# Patient Record
Sex: Female | Born: 1985 | Race: White | Hispanic: No | Marital: Married | Smoking: Never smoker
Health system: Southern US, Community
[De-identification: ages and names within clinical notes are randomized; demographics above are authoritative.]

## PROBLEM LIST (undated history)

## (undated) DIAGNOSIS — D649 Anemia, unspecified: Secondary | ICD-10-CM

## (undated) HISTORY — PX: LAMINECTOMY: SHX219

---

## 2010-10-28 ENCOUNTER — Encounter: Payer: Self-pay | Admitting: Gastroenterology

## 2013-06-05 LAB — HM PAP SMEAR

## 2014-08-21 ENCOUNTER — Ambulatory Visit: Payer: Self-pay | Admitting: Optometry

## 2014-08-21 ENCOUNTER — Encounter: Payer: Self-pay | Admitting: Optometry

## 2014-08-21 DIAGNOSIS — H5213 Myopia, bilateral: Secondary | ICD-10-CM

## 2014-08-23 DIAGNOSIS — H5213 Myopia, bilateral: Secondary | ICD-10-CM | POA: Insufficient documentation

## 2014-08-23 NOTE — Progress Notes (Signed)
Outpatient Visit      Patient name: Peggy White  DOB: May 02, 1986       Age: 30 y.o.  MR#: [id number]    Encounter Date: 08/21/2014    Subjective:      Chief Complaint   Patient presents with    Blurred Vision     DV NV      HPI     Blurred Vision    Additional comments: DV NV            Comments   Peggy White is a 29 y.o. female present today for new PT annual exam  PT is a contact wearer. PT wears Air Optix -3.00 OU. Pt is comfortable   with Current lenses  Pt would like a contact lens rx only. PT considering Dailies   -dr Carroll Sage, -redness, -itching, -burning, -tearing, -photophobia,   -diplopia, -headaches, -pain, -flashes, -floaters     Pt has no history of eye trauma or surgery.   Last exam over 1 yrs ago             currently has no medications in their medication list.     has No Known Allergies (drug, envir, food or latex).      No past medical history on file.   No past surgical history on file.     Specialty Problems        Ophthalmology Problems    Myopia, bilateral               ROS     Positive for: Eyes         Objective:     Base Eye Exam     Visual Acuity (Snellen - Linear)      Right Left   Dist cc 20/20 20/25   Near cc J1+ OU          Tonometry (Tonopen, 3:12 PM)      Right Left   Pressure 14 14         Pupils      Pupils Dark React APD   Right PERRLA 6 Brisk None   Left PERRLA 6 Brisk None         Visual Fields (Counting fingers)      Left Right   Result Full Full         Extraocular Movement      Right Left   Result Full Full         Neuro/Psych     Oriented x3:  Yes    Mood/Affect:  Normal      Dilation     Both eyes:  2.5% Phenylephrine, 1.0% Tropicamide @ 3:13 PM            Additional Tests     Amsler      Right Left   Amsler Normal Normal               Slit Lamp and Fundus Exam     External Exam      Right Left    External Normal ocular adnexae, lacrimal gland & drainage, orbits Normal ocular adnexae, lacrimal gland & drainage, orbits      Slit Lamp Exam      Right Left    Lids/Lashes  Normal structure & position Normal structure & position    Conjunctiva/Sclera Normal bulbar/palpebral, conjunctiva, sclera Normal bulbar/palpebral, conjunctiva, sclera    Cornea Normal epithelium, stroma, endothelium, tear film Normal epithelium, stroma, endothelium, tear film  Anterior Chamber Clear & deep Clear & deep    Iris Normal shape, size, morphology Normal shape, size, morphology    Lens Normal cortex, nucleus, anterior/posterior capsule, clarity Normal cortex, nucleus, anterior/posterior capsule, clarity    Vitreous Clear Clear      Fundus Exam      Right Left    Disc Normal size, appearance, nerve fiber layer Normal size, appearance, nerve fiber layer    C/D Ratio 0.1 0.1    Macula Normal Normal    Vessels Normal Normal    Periphery Normal Normal            Contact Lens Exam     Current Contact Lens Rx      Brand Base Curve Sphere   Right Alcon Vison Care: Air Optix Aqua 8.60 -3.00   Left Alcon Vison Care: Air Optix Aqua 8.60 -3.00         Current Contact Lens Rx #2 (Trial Lens)      Brand Base Curve Sphere   Right Bausch [employer] Lomb: BioTrue Oneday 8.60 -3.00   Left Bausch [employer] Lomb: BioTrue Oneday 8.60 -3.00         Contact History     Replacement Frequency:  Monthly    Solutions Used:  Clear Care                        No annotated images are attached to the encounter.      Assessment/Plan:      1. Myopia, bilateral           PLAN:  1.  Successful soft CL fit with Biotrue 1-day 8.6/-3.00 OD and OS.  CL trials dispensed.  Encouraged daily replacement and to remove nightly before bed and sample of Biotrue solution given for rinsing as needed.  Pt to call/rtc with issues or will follow up in 1-2 weeks.  Dilated ocular health exam today is within normal limits OU.  Monitor yearly or sooner PRN.

## 2014-09-05 ENCOUNTER — Ambulatory Visit: Payer: Self-pay | Admitting: Optometry

## 2014-09-05 ENCOUNTER — Encounter: Payer: Self-pay | Admitting: Optometry

## 2014-09-05 DIAGNOSIS — H5213 Myopia, bilateral: Secondary | ICD-10-CM

## 2014-09-05 NOTE — Progress Notes (Signed)
Outpatient Visit      Patient name: Peggy White  DOB: 12-17-1985       Age: 29 y.o.  MR#: [id number]    Encounter Date: 09/05/2014    Subjective:      Chief Complaint   Patient presents with    Follow-up     [name]     HPI     Follow-up    Additional comments: [name]           Comments   Pt is returning today for [name]  Pt states daily trails gave her a headache. Pt states no discomfort or   irritation besides the headaches Pt would refer to go back to [employer]           currently has no medications in their medication list.     has No Known Allergies (drug, envir, food or latex).      No past medical history on file.   No past surgical history on file.     Specialty Problems        Ophthalmology Problems    Myopia, bilateral               ROS     Positive for: Eyes         Objective:     Base Eye Exam     Visual Acuity (Snellen - Linear)      Right Left   Dist cc 20/20 20/20 -1       Correction:  Contacts      Pupils      Pupils APD   Right PERRLA None   Left PERRLA None         Neuro/Psych     Oriented x3:  Yes    Mood/Affect:  Normal            Slit Lamp and Fundus Exam     External Exam      Right Left    External Normal ocular adnexae, lacrimal gland & drainage, orbits Normal ocular adnexae, lacrimal gland & drainage, orbits      Slit Lamp Exam      Right Left    Lids/Lashes Normal structure & position Normal structure & position    Conjunctiva/Sclera Normal bulbar/palpebral, conjunctiva, sclera Normal bulbar/palpebral, conjunctiva, sclera    Cornea Normal epithelium, stroma, endothelium, tear film Normal epithelium, stroma, endothelium, tear film    Anterior Chamber Clear & deep Clear & deep    Iris Normal shape, size, morphology Normal shape, size, morphology    Lens Normal cortex, nucleus, anterior/posterior capsule, clarity Normal cortex, nucleus, anterior/posterior capsule, clarity    Vitreous Clear Clear            Contact Lens Exam     Current Contact Lens Rx      Brand Base Curve Sphere   Right  Alcon Vison Care: Air Optix Aqua 8.60 -3.00   Left Alcon Vison Care: Air Optix Aqua 8.60 -3.00         Current Contact Lens Rx #2 (Trial Lens)      Brand Base Curve Sphere   Right Bausch & Lomb: BioTrue Oneday 8.60 -3.00   Left Bausch [employer] Lomb: BioTrue Oneday 8.60 -3.00         Contact History     AWT:  0    WTT:  0    Replacement Frequency:  Daily    Care Regimen:  Normal      Final  Contact Lens Rx      Brand Base Curve Sphere   Right Alcon Vison Care: Air Optix Aqua 8.60 -3.00   Left Alcon Vison Care: Air Optix Aqua 8.60 -3.00       Expiration Date:  09/06/2015    Replacement:  Monthly    Solutions:  BioTrue                Final Contact Lens Rx      Brand Base Curve Sphere   Right Alcon Vison Care: Air Optix Aqua 8.60 -3.00   Left Alcon Vison Care: Air Optix Aqua 8.60 -3.00       Expiration Date:  09/06/2015    Replacement:  Monthly    Solutions:  BioTrue              No annotated images are attached to the encounter.      Assessment/Plan:      1. Myopia, bilateral           PLAN:  1.  Successful soft CL re-fit today with Air Optix Aqua 8.6/-3.00 OD and OS, did not like trial of Biotrue 1-day.  Good fit, comfort, movement and vision with [employer].  CL rx dispensed.  Encouraged monthly replacement and to remove nightly before bed but ok for extended wear up to one week.  Pt to call/rtc if changes noted.  Monitor yearly.

## 2014-10-17 ENCOUNTER — Ambulatory Visit: Payer: Self-pay | Admitting: Primary Care

## 2014-10-22 ENCOUNTER — Encounter: Payer: Self-pay | Admitting: Pediatrics

## 2014-10-22 ENCOUNTER — Ambulatory Visit: Payer: Self-pay | Admitting: Pediatrics

## 2014-10-22 VITALS — BP 120/70 | HR 64 | Resp 18 | Ht 67.0 in | Wt 190.0 lb

## 2014-10-22 DIAGNOSIS — D500 Iron deficiency anemia secondary to blood loss (chronic): Secondary | ICD-10-CM

## 2014-10-22 DIAGNOSIS — Z23 Encounter for immunization: Secondary | ICD-10-CM

## 2014-10-22 DIAGNOSIS — D649 Anemia, unspecified: Secondary | ICD-10-CM | POA: Insufficient documentation

## 2014-10-22 DIAGNOSIS — D241 Benign neoplasm of right breast: Secondary | ICD-10-CM | POA: Insufficient documentation

## 2014-10-22 NOTE — Progress Notes (Signed)
Health Maintenance Visit, Female Aged 30-49 Years    Peggy White is a 29 y.o. female here for a HCM Visit     New patient here to our office.  Previous provider was in [place] where she was in graduate school.   Originally from [place].  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    Surgical History  Past Surgical History   Procedure Laterality Date    Lumbar laminectomy  2007     At [facility] - L4/L5       Medications  No current outpatient prescriptions on file prior to visit.     No current facility-administered medications on file prior to visit.       Allergies  No Known Allergies (drug, envir, food or latex)    Social History  History   Substance Use Topics    Smoking status: Never Smoker     Smokeless tobacco: Not on file    Alcohol Use: Not on file     History     Social History Narrative       Family History  Family History   Problem Relation Age of Onset    No Known Problems Mother     Alcohol abuse Father      past    No Known Problems Sister     No Known Problems Brother     Diabetes Maternal Uncle     Cancer Maternal Grandmother      pancreatic (67), cervical    Cancer Maternal Grandfather      breast cancer (72s)    Cancer Paternal Grandmother 35     breast / other (possible cervical)    Diabetes Paternal Grandmother           Breast Cancer genetics referral screening tool -   Risk Factor Breast Cancer at Age ?49 y Ovarian Cancer at Any Age   Yourself      Mother      Sister      Daughter      Mother's side      Grandmother      Aunt      Father's side      Grandmother      Aunt       ?2 cases of breast cancer after age 64 y on the same side of the family    Female breast cancer at any age in any relative    Jewish ancestry      A patient completes the checklist if she has a family history of breast or ovarian cancer and receives a referral if  ?2 items are checked    MENSTRUAL HISTORY: regular every 28-30 days    Preventive Care  Health Maintenance Due   Topic Date Due    HIV TESTING OFFERED  04/13/1998       Screening    Depression (PHQ2)   Often been bothered by feeling down, depressed or hopeless: 0   Often been  bothered by little interest or pleasure in doing things: 0    Alcohol and Drug Use (CAGE-AID)   Tried to Cut down?  No     Annoyed by Criticism?  No     Felt Guilty?  No     Had an Eye opener?  No      Physical Exam   BP 120/70 mmHg   Pulse 64   Resp 18   Ht 1.702 m (_0 )   Wt 86.183 kg (190 lb)   BMI 29.75 kg/m2   LMP 10/15/2014 (Exact Date)  GEN:  Comfortable, healthy-appearing, normal body habitus  HEAD: Normocephalic and atraumatic, scalp normal  EYES: Sclera/conjunctiva/lids normal, PERRL, EOMI  NOSE: Nasal mucosa normal, no septal abnormalities, no discharge  MOUTH: good dentition, tongue normal, pharynx without erythema or lesions  NECK: No lymphadenopathy or masses, thyroid not enlarged  CV:  Normal S1/split S2, murmur absent, no S3/S4    PULM; Lungs clear, no increased work of breathing  ABD: normal BS, no tenderness, no HSM or masses  EXT:  No cyanosis, clubbing or edema  MSK: No joint swelling or deformities  SKIN:  No rash or jaundice, no suspicious lesions  NEURO:  Alert, oriented, no abnormalities of strength or coordination    Review of Systems   Constitutional: Positive for weight loss (intentional). Negative for fever, chills and malaise/fatigue.   HENT: Negative for congestion and sore throat.    Eyes: Negative for blurred vision and double vision.   Respiratory: Negative for cough, sputum production, shortness of breath and wheezing.    Cardiovascular:  Negative for chest pain, palpitations and leg swelling.   Gastrointestinal: Positive for diarrhea. Negative for heartburn, nausea and constipation.   Genitourinary: Negative for dysuria and frequency.   Musculoskeletal: Negative for myalgias and joint pain.   Skin: Negative for itching and rash.   Neurological: Negative for dizziness, tingling, sensory change and headaches.   Psychiatric/Behavioral: Negative for depression and substance abuse. The patient is not nervous/anxious and does not have insomnia.          ASSESSMENT & PLAN  Routine Well Adult Visit    Screening    Weight: overweight   Counseling provided: yes   Dietary referral initiated: no   Blood Pressure: normal   Lifestyle changes discussed: no   Sexually active: yes   Urine Chlamydia and GC DNA amplification testing ordered: not indicated  (USPSTF recommends Chlamydia in all women < 25, women > 25 yrs at increased risk)   HIV test ordered: not indicated ([place] law requires all > 13 to be offered HIV testing at least once) (had in past)   Plan B script given: no   Contraception discussed: no  - trying to get pregnant - discussed folate and timing of sex     Concerns about intimate partner violence: no   Problem alcohol use identified: no   Tobacco Use identified: no   Other Substance Abuse identified: no    Lipid screening not indicated (USPSTF recommends at age 23 unless smoker, hypertensive, obese, or FH of premature CVD)   Hepatitis C testing ordered not indicated  (USPSTF recommends testing all patients born between [birth year]-1965.  [place] law requires 1 time testing in same group)   Diabetes screening  not indicated   (USPSTF recommends only if sustained BP > 135/80)   PAP smear recommended (every 3 yrs till 30, after 30 every 5 if with HPV testing)   PAP up to date: Yes --  Date: 2016   Action taken: none   Breast Cancer risk assessment done   Risk BRCA mutation increased: no  (referral to genetics if risk assessment tool above  positive)   Breast Cancer screening discussed (USPSTF recommends discussion starting at age 61)   Mammogram: not indicated (see problem list - has had imaging for fibroadenoma)   Discussed importance of adequate Calcium and Vitamin D intake   Recommended Folic Acid 0.8 mg daily if considering pregnancy    Counseling/Anticipatory Guidance   Counseled on nutrition diet and regular exercise    Counseled regarding safety, seat belts, helmets, and common causes of morbidity/mortality in this patient's age group   Counseled regarding safer sex practices and STI's   Counseled about risks of skin cancer and importance of sunscreen protection (USPSTF recommends ages 35-24)   Advance directives discussed and patient given HCP form    Immunizations/Prophylaxis   The vaccination status of the following vaccines were addressed at today's visit:   Tdap already has   HPV  already has   MMR was not indicated (for women of child-bearing age, consider checking titers if status unknown)   Varivax was not indicated (for women of child-bearing age, consider checking titers if status unknown)   Prevnar 30 was not indicated  (only if high risk: If no prior Pneumovax give Prevnar 13 then Pneumovax in 8 weeks.  If prior Pneumovax give Prevnar if > 1 year.   Pneumovax was not indicated (only if high risk,  Some patients needing Pneumovax need Prevnar 13 as above)   Hepatitis B was not indicated   (for those at risk)   Influenza already has   Hep A given - going on cruise and requested.     Orders Placed This Encounter   No orders placed during this encounter.         Return in about 6 months (around 04/23/2015) for Hep A #2.    Next periodic health supervision visit due 2  years      Denyse Amass, MD

## 2014-10-23 NOTE — Progress Notes (Signed)
Review of Systems   (Positive items noted in bold, otherwise listed items are negative)  General: fatigue, weight change, loss of appetite, fever, night sweats, weakness  ENT: hearing difficulty, ear ringing or pain, sinus problems, nasal congestion, nosebleeds, oral lesions, dental problems, throat pain  CV: irregular heartbeat, racing heart, chest pain, leg swelling, leg pain with walking, DOE  Resp: shortness of breath, prolonged or productive cough, wheezing, pleuritic pain  GI: dysphagia, heartburn, nausea, vomiting, constipation, diarrhea, abdominal pain, blood in stool, melena, changes in bowel habits  GU: dysuria, urinary frequency or urgency, urinary incontinence, nocturia, impotence  MSK: joint pain, joint swelling, back pain, myalgias, muscle weakness  Derm: rash, itching, new or changing skin lesions, hair loss or change  Neuro: headache, vision changes, numbness, parasthesias, balance or gait difficulty, dizziness, tremor, uncontrolled movements  Psych: insomnia, irritability, depression, anxiety, mood swings, frequent anger, hallucinations  Endocrine: heat or cold intolerance, polydipsia, libido changes, menstrual irregularities, breast lumps  Heme: easy bruising or bleeding, unexplained swelling  Allergy and Immunology: itchy or watery eyes, itchy or runny nose, frequent infections

## 2014-11-01 ENCOUNTER — Telehealth: Payer: Self-pay | Admitting: Pediatrics

## 2014-11-01 NOTE — Telephone Encounter (Signed)
Medical records received from [facility]

## 2014-11-20 ENCOUNTER — Encounter: Payer: Self-pay | Admitting: Pediatrics

## 2014-12-13 LAB — HIV 1&2 ANTIGEN/ANTIBODY

## 2014-12-13 LAB — HEPATITIS B SURFACE ANTIGEN: HBV S Ag: NEGATIVE — NL

## 2014-12-13 LAB — RUBELLA ANTIBODY, IGG: Rubella IgG AB: IMMUNE — NL

## 2014-12-13 LAB — ABO/RH: ABO RH Blood Type: O POS — NL

## 2015-01-07 ENCOUNTER — Ambulatory Visit
Admit: 2015-01-07 | Discharge: 2015-01-07 | Disposition: A | Discharge status: Home / Self Care | Payer: Self-pay | Source: Ambulatory Visit | Admitting: Obstetrics and Gynecology

## 2015-01-09 LAB — MATERNAL 1ST TRIMESTER SCR (11-13 6/7 WEEKS)
Age at Delivery: 30.3 years
CRL: 53 mm
DS A Priori Risk: 1:515 {titer}
DS Screen Risk: <1:27500 {titer}
HCG MoM: 1
NT MoM: 0.94
NT: 1.2 mm
PAPP-A MoM: 2.45
PAPP-A: 1057 ng/mL
Patient Weight: 192 [lb_av]
T18 A Priori Risk: 1:1280 {titer}
T18 Screen Risk: 1:99000 {titer}
hCG: 79714 m[IU]/mL

## 2015-01-31 ENCOUNTER — Ambulatory Visit
Admit: 2015-01-31 | Discharge: 2015-01-31 | Disposition: A | Discharge status: Home / Self Care | Payer: Self-pay | Source: Ambulatory Visit | Attending: Obstetrics and Gynecology | Admitting: Obstetrics and Gynecology

## 2015-02-01 LAB — MATERNAL AFP ONLY (14-22 67 WEEKS)
AFP MoM: 1.22
AFP: 25 IU/mL
Age at Delivery: 30.3 years
OSB Risk: 1:6870 {titer}
Patient Weight: 192 [lb_av]

## 2015-04-22 ENCOUNTER — Ambulatory Visit
Admission: RE | Admit: 2015-04-22 | Discharge: 2015-04-22 | Disposition: A | Discharge status: Home / Self Care | Payer: Self-pay | Source: Ambulatory Visit | Attending: Obstetrics | Admitting: Obstetrics

## 2015-04-22 LAB — HEMOGLOBIN ELECTROPHORESIS
Hgb A1: 97.6 % (ref 96.8–97.8)
Hgb A2: 2.4 % (ref 2.2–3.2)
Interp,HBE: NORMAL

## 2015-04-22 LAB — GLUCOSE TOLERANCE, 1 HOUR: Glucose,50gm 1HR: 94 mg/dL (ref 63–135)

## 2015-04-22 LAB — MCHC: MCHC: 32 g/dL (ref 32–36)

## 2015-04-22 LAB — HEMATOCRIT: Hematocrit: 31 % — ABNORMAL LOW (ref 34–45)

## 2015-04-23 LAB — HGB ELECT,REVIEW

## 2015-04-23 LAB — LEAD VENOUS: Lead,Venous: <1 ug/dl (ref 0–5)

## 2015-04-23 LAB — LEAD, BLOOD

## 2015-04-25 ENCOUNTER — Ambulatory Visit: Payer: Self-pay

## 2015-06-12 ENCOUNTER — Encounter: Payer: Self-pay | Admitting: Nurse Practitioner

## 2015-06-12 ENCOUNTER — Ambulatory Visit: Payer: Self-pay

## 2015-06-12 NOTE — Anesthesia Preprocedure Evaluation (Addendum)
Anesthesia Pre-operative History and Physical for Peggy White    ______________________________________________________________________________________    Summary:    I have reviewed the patient's clinical information and erecords for CPM. The patient isG1P0 with a history of lumbar laminectomy (L4-L5) and anesthesia evaluation is requested prior to possible regional anesthesia.     Past medical history is significant for:  -lumbar laminectomy L4-L5 2007    TC to patient.  She denies any back pain.  The lumbar laminectomy was successful in 2007.  She had no problems with the Anesthesia.  No additional cardiopulmonary testing or in person anesthesia evaluation is indicated for this procedure.  By Lorrene Reid, NP at 3:15 PM on 06/12/2015    <[email or web address]>  ROS/MED HX Not Completed  Physical Exam Not Completed________________________________________________________________________  Scheryl Darter Plan  Anesthesia Consent Not Performed

## 2015-06-21 ENCOUNTER — Ambulatory Visit
Admission: RE | Admit: 2015-06-21 | Discharge: 2015-06-21 | Disposition: A | Discharge status: Home / Self Care | Payer: Self-pay | Source: Ambulatory Visit | Attending: Obstetrics | Admitting: Obstetrics

## 2015-06-23 LAB — GROUP B STREP CULTURE: Group B Strep Culture: 0

## 2015-06-26 ENCOUNTER — Encounter: Payer: Self-pay | Admitting: Gastroenterology

## 2015-07-11 ENCOUNTER — Ambulatory Visit: Payer: Self-pay

## 2015-07-11 ENCOUNTER — Encounter: Payer: Self-pay | Admitting: Gastroenterology

## 2015-07-11 ENCOUNTER — Other Ambulatory Visit: Payer: Self-pay | Admitting: Obstetrics

## 2015-07-11 DIAGNOSIS — Z3480 Encounter for supervision of other normal pregnancy, unspecified trimester: Secondary | ICD-10-CM

## 2015-07-14 NOTE — L&D Delivery Note (Addendum)
Delivery Summary Note  Patient: Peggy White  Age: 30 y.o.  Date of Birth: 04-Dec-1985  CE:[id number]  MRN: [id number]    Admission Summary  Date and time of admission: 07/15/2015  4:06 PM Attending Provider: Jeralyn Bennett, MD Provider Group : [facility]    Diagnosis    SVD (spontaneous vaginal delivery)    Normal pregnancy     Allergies:   Review of patient's allergies indicates no known allergies (drug, envir, food or latex).  Weight:  PrePregnancy Weight: 85.3 kg (188 lb) Weight: 100.7 kg (222 lb) Pregnancy weight change (kg): 15.42 kg   Obstetric History    G1   P0   T0   P0   A0   TAB0   SAB0   E0   M0   L0       Breast or Formula Feeding: Breast feeding      Prenatal Labs  ABO RH BLOOD TYPE   Date Value Ref Range Status   07/15/2015 O RH POS  Final     ANTIBODY SCREEN   Date Value Ref Range Status   07/15/2015 Negative  Final     RUBELLA IGG AB   Date Value Ref Range Status   12/13/2014 Immune  Final     HBV S AG   Date Value Ref Range Status   12/13/2014 Neg  Final     GROUP B STREP CULTURE   Date Value Ref Range Status   06/21/2015 .  Final     HIV 1&2 ANTIGEN/ANTIBODY   Date Value Ref Range Status   12/13/2014 NR  Final      Dating Information  Patient's last menstrual period was 10/14/2014. EDD: 07/21/2015, by Last Menstrual Period  Information for the patient's newborn:  Kalyiah, [place] Girl [id number]     Delivery Information  Girl Stracener  Sex: female Gestational Age: [redacted]w[redacted]d MRN: [id number] PCP: Lina Sayre, DO   Delivery Date/Time: 07/16/2015 11:59 AM   Time of Head Delivery: 07/16/2015 11:59 AM  Delivery Type: Vaginal, Spontaneous Delivery        Meconium at time of delivery: none  Delivery Location: 316      Labor Onset Date/Time: 07/16/2015 8:26 AM Dilation Complete Date/Time: 1/3/201711:48 AM     Preterm labor: No Antenatal steroids: None Antibiotics received during labor: No      First Cervical ripening date/time:   /   Cervical ripening Type: Misoprostol        Rupture Date: 07/16/2015 Rupture Time: 8:26  AM  Details:   Rupture Type: Artificial Color: Clear Amount: Moderate  Induction:     Indications:   Augmentation: Oxytocin  Labor complications: None     Delivering clinician:  Jeralyn Bennett   Other personnel:   Provider Role   Durwin Nora Nurse Practitioner   Marvetta Gibbons Delivery Nurse   Franky Macho LPN   Domingo Sep Registered Nurse            Anesthesia Method: Epidural-   Analgesics:        Presentation: Vertex Position: Left Occiput Anterior  Prophylactic Maneuver: No     Shoulder Dystocia: No                                                         Resuscitation: Dry;Tactile  Stimulation;Bulb Suctioning  Living Status: Yes           APGARs Total Color Reflex irritability Breath Heart Rate Muscle Tone Assigned By   (greater than 7 no need for next measurement)   1 min 9  1  2  2  2  2   K. Verdine RN   5 min 9  1  2  2  2  2   K. Verdine RN   10 min                 15 min                 20 min                 25 min                 30 min                   Birth Weight:   Height:   Head Circumference:   Observed Anomalies:      Cord: 3 Vessels     Complications: NONE          Clamping Delayed: 0  Clamped Date/Time:1/3 12:00 PM  Cord blood disposition: Lab     Gases sent: Yes       Stem cell collection -by MD-: No  Maternal Info:   Placenta Delivery Date/Time: 1/3 12:10 PM     Removal: Spontaneous     Appearance: Intact     Disposition: pathology  Bonding:     Stages of Labor:          Stage One:   h   m          Stage Two:   h   m          Stage Three:   h   m  Episiotomy: Median           Perineal lacerations:                                   Delivery est. blood loss (mL): 400.00       Needle Count: Correct        Sponge Count: Correct  Procedures: None           30 year old G1P0 admitted for elective induction of labor. Patient's pregnancy risk factors include none. She is now s/p spontaneous vaginal delivery over intact perineum to live female infant, with Apgars of 9 and 9. Head  delivered in LOA position.  Patient had a median episiotomy, repaired with 3-0 vicryl and local anesthesia in the usual fashion. Placenta delivered spontaneously intact with 3 vessel cord. Fundus firmed with vigorous massage. Misoprostol 800 mcg, methergine 0.2 mg IM was administered with good effect on bleeding.  EBL 400. Patient and infant tolerated delivery well.    Dr. Geraldo Pitter was present for the entire delivery.

## 2015-07-15 ENCOUNTER — Encounter: Payer: Self-pay | Admitting: Obstetrics

## 2015-07-15 ENCOUNTER — Inpatient Hospital Stay
Admission: AD | Admit: 2015-07-15 | Disposition: A | Discharge status: Home / Self Care | Payer: Self-pay | Source: Ambulatory Visit | Attending: Obstetrics | Admitting: Obstetrics

## 2015-07-15 DIAGNOSIS — Z3490 Encounter for supervision of normal pregnancy, unspecified, unspecified trimester: Secondary | ICD-10-CM

## 2015-07-15 LAB — CBC
Hematocrit: 33 % — ABNORMAL LOW (ref 34–45)
Hemoglobin: 11 g/dL — ABNORMAL LOW (ref 11.2–15.7)
MCH: 26 pg/cell (ref 26–32)
MCHC: 34 g/dL (ref 32–36)
MCV: 78 fL — ABNORMAL LOW (ref 79–95)
Platelets: 236 10*3/uL (ref 160–370)
RBC: 4.2 MIL/uL (ref 3.9–5.2)
RDW: 14.9 % — ABNORMAL HIGH (ref 11.7–14.4)
WBC: 11.9 10*3/uL — ABNORMAL HIGH (ref 4.0–10.0)

## 2015-07-15 LAB — TYPE AND SCREEN
ABO RH Blood Type: O POS
Antibody Screen: NEGATIVE

## 2015-07-15 MED ORDER — LACTATED RINGERS IV BOLUS *I*
1000.0000 mL | INTRAVENOUS | Status: DC | PRN
Start: 2015-07-15 — End: 2015-07-16

## 2015-07-15 MED ORDER — MISOPROSTOL 25 MCG QUARTER TAB *I*
ORAL_TABLET | ORAL | Status: AC
Start: 2015-07-15 — End: 2015-07-15
  Administered 2015-07-15: 50 ug via ORAL
  Filled 2015-07-15: qty 2

## 2015-07-15 MED ORDER — MISOPROSTOL 100 MCG PO TABS *I*
100.0000 ug | ORAL_TABLET | ORAL | Status: DC
Start: 2015-07-15 — End: 2015-07-16
  Administered 2015-07-15 – 2015-07-16 (×2): 100 ug via ORAL
  Filled 2015-07-15 (×2): qty 1

## 2015-07-15 MED ORDER — LACTATED RINGERS IV SOLN *I*
150.0000 mL/h | INTRAVENOUS | Status: DC
Start: 2015-07-15 — End: 2015-07-16
  Administered 2015-07-16: 999 mL/h via INTRAVENOUS
  Administered 2015-07-16 (×2): 150 mL/h via INTRAVENOUS

## 2015-07-15 MED ORDER — MISOPROSTOL 25 MCG QUARTER TAB *I*
50.0000 ug | ORAL_TABLET | Freq: Once | ORAL | Status: AC
Start: 2015-07-15 — End: 2015-07-15

## 2015-07-15 NOTE — Progress Notes (Addendum)
OBSTETRICS INTRAPARTUM PROGRESS NOTE       Subjective     Pt feels well, does not desire intervention to sleep.    Objective     Vitals:    07/15/15 2030 07/15/15 2100 07/15/15 2128 07/15/15 2200   BP: 109/61  118/70    Pulse: 59 60 56 55   Resp: 18  18    Temp:   36.9 C (98.4 F)    TempSrc:   Temporal    SpO2:   100%    Weight:       Height:           Most recent cervical exam:    Closed/40/-2 in the office       Membranes:   Membrane Status: Intact           Fetal Monitoring:  Baseline: 140 bpm  Variability: moderate  Accelerations: present  Decelerations: occasional variable decels  Category: 2  Toco: q 1-3 min, irregular      Assessment & Plan     Peggy White is a 30 y.o. G1P0 at [redacted]w[redacted]d with pregnancy complicated by [facility] > 0000000 with HC/AC of 0.92 admitted for IOL.    1. FHT: Category II for decels. Interventions: CEFM.  2. Labor assessment: s/p Miso x2, last dose @ 21:30.  3. Pain management: No intervention at this time, plan for M&P overnight if needed.  4. GBS status: negative.   5. Labor Risks:   - AC>97%ile  - HC/AC 0.92  - EFW 3641g by 12/29 USN      Arlean Hopping, DO  Obstetrics/Gynecology PGY-1  Pager [phone]

## 2015-07-15 NOTE — Progress Notes (Signed)
OBSTETRICS INTRAPARTUM PROGRESS NOTE       Subjective     In to meet pt.  Feeling well.      Objective     Vitals:    07/15/15 1616 07/15/15 1617 07/15/15 1640 07/15/15 1727   BP:  123/79  124/79   Pulse:  81 78 71   Resp:  16 16    Weight: 100.7 kg (222 lb)      Height: 1.702 m (5\' 7" )          Most recent cervical exam:    Closed/40/-2 in the office       Membranes:   Membrane Status: Intact           Fetal Monitoring:  Baseline: 145 bpm  Variability: moderate  Accelerations: present  Decelerations: Absent  Category: 1  Toco: Not yet contracting      Assessment & Plan     Peggy White is a 30 y.o. G1P0 at [redacted]w[redacted]d with pregnancy complicated by [facility] > 0000000 with HC/AC of 0.92 admitted for IOL.    1. FHT: Category I. Interventions: CEFM.  2. Labor assessment: s/p Miso x1, last dose @ 17:27.  3. Pain management: No intervention at this time, plan for M&P overnight.  4. GBS status: negative.   5. Labor Risks:   - AC>97%ile  - HC/AC 0.92  - EFW 3641g by 12/29 USN      Arlean Hopping, DO  Obstetrics/Gynecology PGY-1  Pager [phone]

## 2015-07-15 NOTE — Progress Notes (Signed)
OBSTETRICS INTRAPARTUM PROGRESS NOTE       Subjective     Pt doing well, feels occasional cramping.      Objective     Vitals:    07/15/15 1834 07/15/15 1902 07/15/15 1915 07/15/15 1930   BP: 110/68  106/64    Pulse: 76 75 67 65   Resp: 18 16 18     Temp:   37 C (98.6 F)    TempSrc:   Temporal    Weight:       Height:           Most recent cervical exam:    Closed/40/-2 in the office       Membranes:   Membrane Status: Intact           Fetal Monitoring:  Baseline: 145 bpm  Variability: moderate  Accelerations: present  Decelerations: Absent  Category: 1  Toco: q 2-3 min, irregular      Assessment & Plan     Peggy White is a 30 y.o. G1P0 at [redacted]w[redacted]d with pregnancy complicated by [facility] > 0000000 with HC/AC of 0.92 admitted for IOL.    1. FHT: Category I. Interventions: CEFM.  2. Labor assessment: s/p Miso x1, last dose @ 17:27.  3. Pain management: No intervention at this time, plan for M&P overnight.  4. GBS status: negative.   5. Labor Risks:   - AC>97%ile  - HC/AC 0.92  - EFW 3641g by 12/29 USN      Arlean Hopping, DO  Obstetrics/Gynecology PGY-1  Pager [phone]

## 2015-07-15 NOTE — H&P (Signed)
OBSTETRICS ADMISSION HISTORY AND PHYSICAL      Reason for Admission (Chief Complaint): IOL    HPI     Peggy White is a 30 y.o. G1P0 at [redacted]w[redacted]d by LMP c/w 8w ultrasound with pregnancy complicated by risks outlined below who presents for induction of labor. Reports good fetal movement. Denies any contraction, loss of fluid, or vaginal bleeding.      Pregnancy Risks     Suspected LGA  - HC/AC 0.92  - AC >97th percentile  - 3641 g on 12/29 [place]      Past Medical History   History reviewed. No pertinent past medical history.      Past Surgical History     Past Surgical History   Procedure Laterality Date    Lumbar laminectomy  2007     At [facility] - L4/L5       Obstetrical History     OB History   Gravida Para Term Preterm AB SAB TAB Ectopic Multiple Living   1               # Outcome Date GA Lbr Len/2nd Weight Sex Delivery Anes PTL Lv   1 Current                   Allergies   No Known Allergies (drug, envir, food or latex)    Current Home Medications     Prior to Admission medications    Not on File       GYN History, Social History, and Family History reviewed and updated in eRecord.      Review of Systems     A complete ROS was negative unless otherwise noted in HPI.        Prenatal Labs           Lab results: 06/21/15  1715 12/13/14   ABO RH BLOOD TYPE  --  O pos   RUBELLA IGG AB  --  Immune   GROUP B STREP CULTURE .  --    HIV 1&2 ANTIGEN/ANTIBODY  --  NR   HBV S AG  --  Neg        Lab results: 04/22/15  1114   GLUCOSE,50GM 1HR 94             Physical Exam     Vitals:    07/15/15 1616 07/15/15 1617 07/15/15 1640 07/15/15 1727   BP:  123/79  124/79   Pulse:  81 78 71   Resp:  16 16    Weight: 100.7 kg (222 lb)      Height: 1.702 m (5\' 7" )          Mental Status:Alert and oriented x 3  Cardiovascular:Regular rate and rhythm with no murmurs  Respiratory:Clear to auscultate  Abdomen: Soft, gravid, non-tender  Extremities/Skin: No edema noted and BLE non-tender    Pelvic Exam: 0/40/-2 per Dr. Geraldo Pitter 12/28    Estimated Fetal  Weight: 12/29 [place] 3641 grams    Presentation: cephalic by ultrasound    Placental location: left lateral    Fetal Monitoring:  Baseline: 145 bpm  Variability: moderate  Accelerations: Yes 15X15  Decelerations: Absent  Category: I  Toco: none      Assessment & Plan     Peggy White is a 30 y.o. G1P0 at [redacted]w[redacted]d by LMP c/w 8w ultrasound with pregnancy complicated by suspected LGA admitted for IOL.    Admit to [id number], Dr Lesle Chris   - Insert IV   -  CBC, T&S, and Syphilis screen sent on admission.   - Cervix: 0/40/-2 / Membranes: intact   - Presentation: cephalic / EFW: [id number] by Leopolds   - Category I fetal heart tracing.  Continuous EFM.   - Due to G1, risk of PPH is: Low (Type & Screen)    Labor Plan   - Misoprostol for cervical ripening overnight, Pitocin to begin in AM   - Okay for M&P for comfort overnight per Dr. Lesle Chris    Postpartum planning   - Rh positive / HIV negative / GBS negative   - Infant: female.    - Feeding: breast   - PPBC: per attending    Suspected LGA  - HC/AC 0.92  - AC >97th percentile  - 3641 g on 12/29 [place]    D/w Dr. Marcelyn Ditty, MD  Ob/Gyn Resident, [place]  Pager [phone]

## 2015-07-16 ENCOUNTER — Encounter: Payer: Self-pay | Admitting: Obstetrics

## 2015-07-16 ENCOUNTER — Encounter: Payer: Self-pay | Admitting: Anesthesiology

## 2015-07-16 LAB — SYPHILIS SCREEN
Syphilis Screen: NEGATIVE
Syphilis Status: NONREACTIVE

## 2015-07-16 MED ORDER — NALBUPHINE HCL 10 MG/ML IJ SOLN *I*
2.0000 mg | Freq: Four times a day (QID) | INTRAMUSCULAR | Status: DC | PRN
Start: 2015-07-16 — End: 2015-07-16

## 2015-07-16 MED ORDER — ACETAMINOPHEN 325 MG PO TABS *I*
650.0000 mg | ORAL_TABLET | Freq: Four times a day (QID) | ORAL | 0 refills | Status: AC | PRN
Start: 2015-07-16 — End: ?

## 2015-07-16 MED ORDER — DOCUSATE SODIUM 100 MG PO CAPS *I*
100.0000 mg | ORAL_CAPSULE | Freq: Two times a day (BID) | ORAL | Status: DC | PRN
Start: 2015-07-16 — End: 2015-07-18
  Administered 2015-07-16 – 2015-07-17 (×3): 100 mg via ORAL
  Filled 2015-07-16 (×3): qty 1

## 2015-07-16 MED ORDER — LACTATED RINGERS IV SOLN *I*
50.0000 mL/h | INTRAVENOUS | Status: AC
Start: 2015-07-16 — End: 2015-07-17

## 2015-07-16 MED ORDER — BUPIVACAINE HCL 0.25 % IJ SOLUTION *WRAPPED*
Status: DC | PRN
Start: 2015-07-16 — End: 2015-07-16
  Administered 2015-07-16 (×4): 4 mL via EPIDURAL

## 2015-07-16 MED ORDER — METHYLERGONOVINE MALEATE 0.2 MG/ML IJ SOLN *I*
0.2000 mg | Freq: Once | INTRAMUSCULAR | Status: AC
Start: 2015-07-16 — End: 2015-07-16
  Administered 2015-07-16: 0.2 mg via INTRAMUSCULAR

## 2015-07-16 MED ORDER — FENTANYL 2 MCG/ML AND 0.0625% BUPIVACAINE IN 250 ML NS *I*
INTRAMUSCULAR | Status: DC
Start: 2015-07-16 — End: 2015-07-16
  Filled 2015-07-16: qty 250

## 2015-07-16 MED ORDER — ACETAMINOPHEN 325 MG PO TABS *I*
650.0000 mg | ORAL_TABLET | Freq: Four times a day (QID) | ORAL | Status: DC | PRN
Start: 2015-07-16 — End: 2015-07-18

## 2015-07-16 MED ORDER — ELECTRIC BREAST PUMP (FOR PERSONAL USE) *A*
0 refills | Status: AC
Start: 2015-07-16 — End: ?

## 2015-07-16 MED ORDER — IBUPROFEN 600 MG PO TABS *I*
600.0000 mg | ORAL_TABLET | Freq: Four times a day (QID) | ORAL | 0 refills | Status: AC | PRN
Start: 2015-07-16 — End: ?

## 2015-07-16 MED ORDER — FENTANYL 2 MCG/ML AND 0.0625% BUPIVACAINE IN 250 ML NS *I*
14.0000 mL/h | INTRAMUSCULAR | Status: AC
Start: 2015-07-16 — End: 2015-07-17
  Administered 2015-07-16: 14 mL/h via EPIDURAL

## 2015-07-16 MED ORDER — LIDOCAINE-EPINEPHRINE (PF) 1.5 %-1:200000 IJ SOLN *I*
INTRAMUSCULAR | Status: DC | PRN
Start: 2015-07-16 — End: 2015-07-16
  Administered 2015-07-16: 2 mL via EPIDURAL
  Administered 2015-07-16: 3 mL via EPIDURAL

## 2015-07-16 MED ORDER — FENTANYL CITRATE 50 MCG/ML IJ SOLN *WRAPPED*
INTRAMUSCULAR | Status: DC | PRN
Start: 2015-07-16 — End: 2015-07-16
  Administered 2015-07-16: 100 ug via INTRAVENOUS

## 2015-07-16 MED ORDER — IBUPROFEN 600 MG PO TABS *I*
600.0000 mg | ORAL_TABLET | Freq: Four times a day (QID) | ORAL | Status: DC | PRN
Start: 2015-07-16 — End: 2015-07-18
  Administered 2015-07-16 – 2015-07-17 (×3): 600 mg via ORAL
  Filled 2015-07-16 (×3): qty 1

## 2015-07-16 MED ORDER — OXYTOCIN 30 UNITS/500 ML NS *POSTPARTUM* *I*
150.0000 m[IU]/min | INTRAMUSCULAR | Status: AC
Start: 2015-07-16 — End: 2015-07-16
  Administered 2015-07-16: 150 m[IU]/min via INTRAVENOUS
  Administered 2015-07-16: 100 m[IU]/min via INTRAVENOUS

## 2015-07-16 MED ORDER — LIDOCAINE HCL 1 % IJ SOLN *I*
30.0000 mL | INTRAMUSCULAR | Status: DC | PRN
Start: 2015-07-16 — End: 2015-07-18

## 2015-07-16 MED ORDER — MISOPROSTOL 200 MCG PO TABS *I*
800.0000 ug | ORAL_TABLET | Freq: Once | ORAL | Status: AC
Start: 2015-07-16 — End: 2015-07-16
  Administered 2015-07-16: 800 ug via RECTAL

## 2015-07-16 MED ORDER — FENTANYL CITRATE 50 MCG/ML IJ SOLN *WRAPPED*
INTRAMUSCULAR | Status: DC
Start: 2015-07-16 — End: 2015-07-16
  Filled 2015-07-16: qty 2

## 2015-07-16 MED ORDER — OXYTOCIN 30 UNITS IN 500ML NS WRAPPED *I*
INTRAMUSCULAR | Status: AC
Start: 2015-07-16 — End: 2015-07-16
  Administered 2015-07-16: 2 m[IU]/min via INTRAVENOUS
  Filled 2015-07-16: qty 500

## 2015-07-16 MED ORDER — OXYTOCIN 30 UNITS IN 500ML NS WRAPPED *I*
1.0000 m[IU]/min | INTRAMUSCULAR | Status: DC
Start: 2015-07-16 — End: 2015-07-16
  Administered 2015-07-16: 4 m[IU]/min via INTRAVENOUS

## 2015-07-16 MED ORDER — LIDOCAINE HCL 1 % IJ SOLN *I*
INTRAMUSCULAR | Status: DC | PRN
Start: 2015-07-16 — End: 2015-07-16
  Administered 2015-07-16: 3 mL via SUBCUTANEOUS

## 2015-07-16 MED ORDER — MISOPROSTOL 200 MCG PO TABS *I*
ORAL_TABLET | ORAL | Status: DC
Start: 2015-07-16 — End: 2015-07-16
  Filled 2015-07-16: qty 4

## 2015-07-16 MED ORDER — DOCUSATE SODIUM 100 MG PO CAPS *I*
100.0000 mg | ORAL_CAPSULE | Freq: Two times a day (BID) | ORAL | 0 refills | Status: AC | PRN
Start: 2015-07-16 — End: ?

## 2015-07-16 NOTE — Discharge Instructions (Signed)
Brief Summary of Your Hospital Course (including key procedures and diagnostic test results):  You were admitted to the hospital for labor induction. You progressed well and had a vaginal delivery of a girl.  Postpartum you met expected milestones.    Written instructions provided: Welcome to [facility] your OB provider promptly if you experience any of the symptoms in the pre-written guidelines. If you cannot reach your MD/CNM, call their answering service or 9-1-1.    Diet: per pre-written guidelines.    Activity:  Per pre-written guidelines.    Medical Equipment / Supplies  None    [employer]  None

## 2015-07-16 NOTE — Progress Notes (Addendum)
In to meet pt  Chart reviewed  OBSTETRICS INTRAPARTUM PROGRESS NOTE       Subjective     Pt reports that her contractions are getting stronger and more uncomfortable.    Objective     Pt is sitting in bed  FOB at bedside  Most recent cervical exam:   OB Examiner: [place] (07/16/15 0826)  **Dilation: 2  **Effacement (%): 70  **Station: -2    Membranes:   Membrane Status: AROM   Color: Clear Fluid   Rupture Date: 07/16/15  Rupture Time: 0826    Fetal Monitoring:  Baseline: 135 bpm  Variability: moderate  Accelerations: present  Decelerations: Absent  Category: 1  Toco: 2      Assessment & Plan     Peggy White is a 30 y.o. G1P0 at [redacted]w[redacted]d   Oxytocin infusing for labor induction  continuous EFM    Addendum: pt requests an epidural for pain management, Dr Geraldo Pitter in to see pt.

## 2015-07-16 NOTE — Progress Notes (Signed)
OBSTETRICS INTRAPARTUM PROGRESS NOTE       Subjective     Pt sleeping comfortably, per nursing.      Objective     Vitals:    07/16/15 0200 07/16/15 0235 07/16/15 0300 07/16/15 0333   BP:  109/63  96/56   Pulse: 65 54 55 69   Resp:  18  18   Temp:       TempSrc:       SpO2:       Weight:       Height:           Most recent cervical exam:   OB Examiner: Dr. Tonie Griffith (07/16/15 0134)  **Dilation: Fingertip  **Station: -3   **Posterior    Membranes:   Membrane Status: Intact           Fetal Monitoring:  Baseline: 140 bpm  Variability: moderate  Accelerations: present  Decelerations: Occasional variables  Category: 2 for variables  Toco: q1-3 irregular      Assessment & Plan     Peggy White is a 30 y.o. G1P0 at [redacted]w[redacted]d with pregnancy complicated by [facility] > 0000000 with HC/AC of 0.92 admitted for IOL.    1. FHT: Category II for decels. Interventions: CEFM.  2. Labor assessment: s/p Miso x3, last dose @ 01:39.  Plan to come off monitor for breakfast after this miso dose is complete.  3. Pain management: No intervention at this time.  4. GBS status: negative.   5. Labor Risks:   - AC>97%ile  - HC/AC 0.92  - EFW 3641g by 12/29 USN      Arlean Hopping, DO  Obstetrics/Gynecology PGY-1  Pager [phone]

## 2015-07-16 NOTE — Progress Notes (Signed)
Pt taken off monitor at this time as misoprostol protocol is up. Pt eating some toast and walking around before a plan is made for the day.

## 2015-07-16 NOTE — Anesthesia Case Conclusion (Signed)
CASE CONCLUSION  [facility]  Transport  Patient Condition on Handoff  Level of Consciousness:  Alert/talking/calm  Patient Condition:  Stable  Handoff Report to:  RN

## 2015-07-16 NOTE — Progress Notes (Addendum)
OBSTETRICS INTRAPARTUM PROGRESS NOTE       Subjective     In to meet patient.        Objective     Vitals:    07/16/15 0400 07/16/15 0438 07/16/15 0500 07/16/15 0545   BP:  103/56  107/62   Pulse: 70 62 60 66   Resp:  18  18   Temp:    36.2 C (97.2 F)   TempSrc:    Temporal   SpO2:       Weight:       Height:           Most recent cervical exam:   OB Examiner: Dr. Tonie Griffith (07/16/15 0134)  **Dilation: Fingertip  **Station: -3    Membranes:   Membrane Status: Intact           Fetal Monitoring:  Off monitors      Assessment & Plan     Kalin is a 30 y.o. G1P0 at [redacted]w[redacted]d with pregnancy complicated by AC > 0000000 with HC/AC of 0.92 admitted for IOL.      1. FHT: nor currently on monitors   2. Labor assessment: S/p miso x 3, last one at 0139. Fingertip/thick/high at 0134. Will discuss plan with Dr. Geraldo Pitter when she comes on this morning.  3. Pain management: none at this time  4. GBS status: negative.  5. Labor Risks:   - AC>97%ile  - HC/AC 0.92  - EFW 3641g by 12/29 USN    Arti Taggar, MD        Pt states she has some contractions and fairl comfortable. Received 3 misoprostol   Elective iol on pt's request  US shows large AC .41 th pc and risks with  SD and nerve injury were addressed with pt . She decliens a c/s and would like to proceed with iol  AROM with clear fluid and cx at 2 /70/-2   For 2x2 pit until adequate contractile pattern   D/w pt   Fetal heart 150s with good variability

## 2015-07-16 NOTE — Progress Notes (Addendum)
OBSTETRICS INTRAPARTUM PROGRESS NOTE       Subjective     Pt sleeping comfortably.    Objective     Vitals:    07/15/15 2300 07/15/15 2333 07/16/15 0000 07/16/15 0030   BP:  103/58  108/61   Pulse: 55 69 70 75   Resp:  18  18   Temp:       TempSrc:       SpO2:       Weight:       Height:           Most recent cervical exam:    Closed/40/-2 in the office       Membranes:   Membrane Status: Intact           Fetal Monitoring:  Baseline: 140 bpm  Variability: moderate  Accelerations: present  Decelerations: absent  Category: 1  Toco: q 1-3 min, irregular      Assessment & Plan     Peggy White is a 30 y.o. G1P0 at [redacted]w[redacted]d with pregnancy complicated by [facility] > 0000000 with HC/AC of 0.92 admitted for IOL.    1. FHT: Category I. Interventions: CEFM.  2. Labor assessment: s/p Miso x2, last dose @ 21:30.  3. Pain management: No intervention at this time, plan for M&P overnight if needed.  4. GBS status: negative.   5. Labor Risks:   - AC>97%ile  - HC/AC 0.92  - EFW 3641g by 12/29 USN      Arlean Hopping, DO  Obstetrics/Gynecology PGY-1  Pager [phone]

## 2015-07-16 NOTE — Anesthesia Preprocedure Evaluation (Addendum)
Anesthesia Pre-operative History and Physical for Peggy White is a 30 yo female in active labor requesting an epidural for labor analgesia.  Her contractions are every 1-2 minutes.    Is in a microbiology fellowship here at [facility]  Did her residency in [place] and just accepted a job in the [place] area  Husband works in the [employer] doing Herbalist    No heart/lung issues  No recent sicknesses  No issues with the pregnancy    Has had a L4-5 laminectomy in the past - discussed that possibility of the epidural not working quite as well as she would like it to in more detail due to scarring    NKDA    ______________________________________________________________________________________    Summary:  Patient is a 30 y.o. G1P0 at [redacted]w[redacted]d with PMH of L4/5 laminectomy presenting for induction.  Patient is requesting labor epidural for pain management at this time.    No Known Allergies (drug, envir, food or latex)    Lab             07/15/15                       1726          HEMATOCRIT   33*           PLATELETS    236           ABO RH BLOO* O RH POS      ANTIBODY SC* Negative        By Rollene Rotunda, MD at 10:22 AM on 07/16/2015    <[email or web address]>  Anesthesia Evaluation Information Source: records, patient     ANESTHESIA     Denies anesthesia history    GENERAL    + Obesity    HEENT    + Corrective Eyewear            contacts PULMONARY     Denies pulmonary issues    CARDIOVASCULAR     Denies cardiovascular issues  Good(4+METs) Exercise Tolerance    GI/HEPATIC/RENAL  Last PO Intake: >8hr before procedure NEURO/PSYCH     Denies neuro/psych issues  Pertinent(-):  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    ENDO/OTHER     Denies endo issues    HEMALOGIC     Denies hematologic issues       Physical Exam    Airway            Mouth opening: normal            Mallampati: II            TM distance (fb): >3 FB            Neck ROM: full            Airway Impression: easy  Dental    Normal Exam   Cardiovascular  Normal Exam           Rhythm: regular           Rate: normal    Neurologic    Normal Exam    General Survey    Normal Exam   Pulmonary   Normal Exam    breath sounds clear to auscultation    Mental Status     oriented to person, place and time       ________________________________________________________________________  Plan  ASA Score  1  Anesthetic Plan epidural    Induction (routine  IV); Line ( use current access); Monitoring (standard ASA); Positioning (sitting); Pain (caudal/epidural)    Informed Consent     Risks:          Risks discussed were commensurate with the plan listed above with the following specific points: N/V, aspiration, headache, hypotension, failed block and infection , damage to:(blood vessels, nerves), unexpected serious injury, allergic Rx, awareness    Anesthetic Consent:      Anesthetic plan (and risks as noted above) were discussed with patient    Blood products Consent:        Use of blood products discussed with: patient     Plan also discussed with team members including:  attending    Attending Attestation:  As the primary attending anesthesiologist, I attest that the patient or proxy understands and accepts the risks and benefits of the anesthesia plan. I also attest that I have personally performed a pre-anesthetic examination and evaluation, and prescribed the anesthetic plan for this particular location within 48 hours prior to the anesthetic as documented.

## 2015-07-16 NOTE — Lactation Note (Signed)
This note was copied from the chart of Peggy Zoll.  Lactation Consultant Initial Visit   Patient: Peggy White MRN: [id number]  AGE: 30 years    Maternal Information    Mothers Name:  Peggy White  Age: 30 y.o.   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    Breast Assessment: normal with maternal report of growth in pregnancy. 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 Not yet visualized  Nipple Assessment: within normal limits per mother, not visualized    Newborn Assessment    First Name: Peggy White       Sex: female   [place]: 39 2/7  AGA  Apgars: 9 and 9  Disposition: Birth [facility])   Pediatrician: Lina Sayre, DO  Ped Phone: [phone]    Infant Weight:  Birth Weight: 3636 g (8 lb 0.3 oz)   Today's Wt: 3636 g (8 lb 0.3 oz) (Filed from Delivery Summary)   Percent Weight Loss: 0 %    Feedings: breastfeeding and skin to skin    Quality of breast feeds:  infant breast feeding well, reported by bedside RN and as reported by mother  Tongue: N/A at this time, sleeping skin to skin  Palate:  N/A at this time      Interventions and Instructions   Mother states breastfeeding is successful and is confident.  Denies difficulty thus far. Encouraged to request assist with feedings as needed to assure effective latch.     breastmilk storage discussed, Breast/Nipple care, Positioning, Areolar expression, Correct attachment, Frequency/length of feeds, Baby feeding cues, Signs of  effective suckling/letdown, Waking Techniques, Signs of adequate infant intake (# of wet/stool diaper per 24 hrs), Catering manager, Engorgement Management, Resources for HELP, Educated about stages of breast milk production, reinforced cue based feeding and feeding cues, encouraged rooming in and skin to skin prior to feeds and Reviewed CCTV video brochure    Plan   Recommend breastfeeding frequently every 2 to 3 hours or about 8 to 12 times in 24 hours according to hunger cues and continue keeping baby stimulated to stay vigorous at transferring milk to signs of satiation.Request assist  as needed. Follow-up with lactation daily while mother/infant dyad inpatient or by maternal request.  Length of this call/visit: 15-20 minutes              Alan Mulder, RN [facility]  Lactation Consultant

## 2015-07-16 NOTE — Progress Notes (Signed)
Entry   Came in to check on patient as pt was noted to have early decels. She was having urges to push and was noted to be fully dilated and +1 station and oa position   She was encouraged to push and her baseline was int eh 130s with decels to 80s with pushes and recovery after . SVD aided by a median episiotomy and helathy female infant was delivered and no SD was encountered and preventive measures taken   Following the delivery the placenta was spontaneously delivered and noted to be intact with a small succenturate lobe . She was noted to have bright red bleeding and uterus was firm to boggy. Masage and evaluation of clots from cx was carried out.She responded to rectal Miso and IM Methergine was also given. Cx was inspected briefly and no tear was visualized theophylline bleeding reduced to nl lochia   The episiotomy with second degree extension  was sutured  in layers with 3 0 v and ebl 400 cc  Checked in on her an hour later and lochis nl and nurse agrees and epidural cath to be removed   D/w couple

## 2015-07-16 NOTE — Progress Notes (Signed)
OBSTETRICS INTRAPARTUM PROGRESS NOTE       Subjective     In to check pt after 2nd dose of miso.  Reports cramping very tolerable, akin to period cramps.      Objective     Vitals:    07/16/15 0100 07/16/15 0134 07/16/15 0138 07/16/15 0200   BP:   105/56    Pulse: 75 65 65 65   Resp:   18    Temp:   36.4 C (97.5 F)    TempSrc:   Temporal    SpO2:       Weight:       Height:           Most recent cervical exam:   OB Examiner: Dr. Tonie Griffith (07/16/15 0134)  **Dilation: Fingertip  **Station: -3   **Posterior    Membranes:   Membrane Status: Intact           Fetal Monitoring:  Baseline: 140 bpm  Variability: moderate  Accelerations: present  Decelerations: Occasional variables  Category: 2 for variables  Toco: q1-3 irregular      Assessment & Plan     Peggy White is a 30 y.o. G1P0 at [redacted]w[redacted]d with pregnancy complicated by [facility] > 0000000 with HC/AC of 0.92 admitted for IOL.    1. FHT: Category II for decels. Interventions: CEFM.  2. Labor assessment: Cervix thick and posterior on exam.  s/p Miso x3, last dose @ 01:39.  3. Pain management: No intervention at this time, plan for M&P overnight if needed.  4. GBS status: negative.   5. Labor Risks:   - AC>97%ile  - HC/AC 0.92  - EFW 3641g by 12/29 USN      Arlean Hopping, DO  Obstetrics/Gynecology PGY-1  Pager [phone]

## 2015-07-16 NOTE — Progress Notes (Signed)
OBSTETRICS INTRAPARTUM PROGRESS NOTE       Subjective     Pt now has an epidural, complains suprapubic pain.    Objective     Vitals:    07/16/15 1104 07/16/15 1106 07/16/15 1110 07/16/15 1112   BP: 113/73 126/79 126/60 126/60   Pulse: 70 80 62 62   Resp:       Temp:       TempSrc:       SpO2:       Weight:       Height:           Most recent cervical exam:   7/80/-2  Leaking clear fluid    Membranes:   Membrane Status: AROM   Color: Clear Fluid   Rupture Date: 07/16/15  Rupture Time: 0826    Fetal Monitoring:  Baseline: 140 bpm  Variability: moderate, minimal  Accelerations: absent  Decelerations: Early  Category: 1-2  Toco: q2      Assessment & Plan     Peggy White is a 30 y.o. G1P0 at [redacted]w[redacted]d   Active labor  Will place foley catheter now  Dr Geraldo Pitter notified of cervical exam.

## 2015-07-16 NOTE — Anesthesia Procedure Notes (Addendum)
---------------------------------------------------------------------------------------------------------------------------------------    NEURAXIAL BLOCK PLACEMENT  Labor Epidural    Date of Procedure: 07/16/2015 10:46 AM    Patient Location:  OR    Reason for Block: labor epidural    CONSENT AND TIMEOUT     Consent:  Obtained per policy  METHOD:    Patient Position: sitting    Monitoring: blood pressure and pulse oximetry for test dose    Sedation Used: no            For medications used, please see MAR    Level of Sedation: none      Prep: aseptic technique per protocol, povidone-iodine and patient draped      Successful Approach: midline    Successful Location: L3-4    Technique: LOR saline    Attempts (Skin Punctures):  1  NEEDLE AND CATHETER:  Catheter:     Catheter Size: 20 gauge    Catheter Type: closed tip (multi-orifice)  Tunneled: No      Catheter in Space:  5    Catheter at Skin: 8  TESTING AND VALIDATION:    Catheter Aspirate: negative      Test Dose Response: negative  OBSERVATIONS:    Block Completion:  Successfully completed    Sensory Levels: bilateral    Epidural sensory level: T9.  Wet Tap: No      Paresthesia: none  Neuraxial Blood: blood not aspirated      Motor Block: mild    Patient Reaction to Block: tolerated procedure well, pain relief, vitals remained stable and fetal stability  STAFF     Performed by: extender under direct supervision    Attending Attestation: I was present for the entire procedure     Attending: Leandra Kern, Rayan Dyal  Extender: GU, YANG  ----------------------------------------------------------------------------------------------------------------------------------------

## 2015-07-17 LAB — SURGICAL PATHOLOGY

## 2015-07-17 LAB — MCHC: MCHC: 33 g/dL (ref 32–36)

## 2015-07-17 LAB — HEMATOCRIT: Hematocrit: 27 % — ABNORMAL LOW (ref 34–45)

## 2015-07-17 MED ORDER — BENZOCAINE-MENTHOL 20-0.5 % EX AERO *I*
INHALATION_SPRAY | Freq: Four times a day (QID) | CUTANEOUS | Status: DC | PRN
Start: 2015-07-17 — End: 2015-07-18
  Filled 2015-07-17: qty 60

## 2015-07-17 NOTE — Lactation Note (Signed)
This note was copied from the chart of Peggy Borbon.  Lactation Consultant Daily Visit   Patient: Peggy White            AGE: 31 days              Corrected GA: 43w 3d  MRN: [id number]     Maternal Information    Mothers Name:   Peggy White  Visit Type: routine/daily     Maternal Medications:  Current medications are compatible with breast feeding.    Breast Assessment:  Within normal limits      Nipple Assessment: within normal limits      Newborn Assessment      Infant Weight:     Birth Weight: 3636 g (8 lb 0.3 oz)     Today's Wt: 3570 g (7 lb 13.9 oz)     Percent Weight Loss: -1.81 %      Feedings: Newborn to breast and skin to skin    I/O Attempt Pump Breastfeeding Breastmilk Formula Voids Stools   Last 19 hrs 2 0 5 0 0 1 4     Tongue and palate Assessment:  Not assessed at this time - baby to breast.     Interventions and Instructions   Mother stated baby is nursing well.  Reviewed stage II breast care.      Plan   Follow-up with lactation daily while mother/infant dyad inpatient or by maternal request.  Length of this call/visit: 5-10 minutes         Sharlett Iles, RN  Lactation Consultant

## 2015-07-17 NOTE — Anesthesia Postprocedure Evaluation (Signed)
Anesthesia Post-Op Note    Patient: Peggy White    Procedure(s) Performed:  Procedure Summary     Date Anesthesia Start Anesthesia Stop Room / Location    07/16/15 1038 [id number]        Procedure Diagnosis Scheduled Providers Attending Anesthesia    LABOR ANALGESIA No diagnosis on file.  Alveta Heimlich, MD        Recovery Vitals  BP: 102/62 (07/17/2015  5:27 AM)  Heart Rate: 68 (07/17/2015  5:27 AM)  Resp: 18 (07/17/2015  5:27 AM)  Temp: 36.4 C (97.5 F) (07/17/2015  5:27 AM)  SpO2: 100 % (07/15/2015  9:28 PM)  O2 Device: None (Room air) (07/17/2015 12:15 AM)   0-10 Scale: 2 (07/17/2015  6:25 AM)  Anesthesia type:  Epidural  Complications Noted During Procedure or in PACU:  None   Comment:    Patient Location:  Labor and Delivery  Level of Consciousness:    Recovered to baseline and awake  Patient Participation:     Able to participate  Temperature Status:    Normothermic  Oxygen Saturation:    Within patient's normal range  Cardiac Status:   Within patient's normal range  Fluid Status:    Stable  Airway Patency:     Yes  Pulmonary Status:    Baseline  Neuraxial Block Evaluation:    No residual motor or sensory symptoms  Pain Management:    Adequate analgesia  Nausea and Vomiting:  None  Comments:    Pt denies back pain, bruising at site, leg weakness/numbness, headache.   Post Op Assessment:    Tolerated procedure well   Attending Attestation:  All indicated post anesthesia care provided     -

## 2015-07-17 NOTE — Progress Notes (Addendum)
OBSTETRICS VAGINAL DELIVERY PROGRESS NOTE   Postpartum Day: 1    Subjective     Peggy White is doing well.  Pain is well-controlled with current regimen.  Tolerating regular diet without nausea/vomiting.  She has ambulated.  Denies chest pain, SOB, or lightheadedness.  Appropriate vaginal bleeding.  Baby is doing well, currently skin to skin with mom.      Objective     Vitals:    07/16/15 1647 07/16/15 1957 07/17/15 0008 07/17/15 0527   BP: 123/72 106/59 105/58 102/62   BP Location:   Left arm Right arm   Pulse: 72 80 80 68   Resp: _0 Temp: 37.5 C (99.5 F) 37.3 C (99.1 F) 36.1 C (97 F) 36.4 C (97.5 F)   TempSrc: Temporal Temporal Temporal Temporal   SpO2:       Weight:       Height:           Mental Status: Alert and oriented x 3  Cardiovascular: regular rate  Respiratory: normal work of breathing on room air  Abdomen: deferred. Skin to skin with baby  Neurological: grossly normal      Labs       Recent Labs  Lab 07/17/15  0537 07/15/15  1726   HEMATOCRIT 27* 33*       ABO RH BLOOD TYPE (no units)   Date Value   07/15/2015 O RH POS                  RUBELLA IGG AB (no units)   Date Value   12/13/2014 Immune         Assessment & Plan     Peggy White is a 30 y.o. G1P1001 on PPD# 1 s/p Vaginal, Spontaneous Delivery  at [redacted]w[redacted]d  Doing well.    Routine postpartum care:  - Rh status as above. Rhogam is not indicated.    - Infant: female.  - Feeding: breast  - PPBC: per attending  - PPHct as above. Ferrous sulfate is not indicated on discharge.  - Immunizations: MMR is not indicated prior to discharge      Disposition: Plan for D/C home PPD # 2.    Arti Taggar, MD    Doing well   Ut = 18 wks non tender   Ext non tender and tr edema   Pp instructions given

## 2015-07-18 NOTE — Lactation Note (Addendum)
This note was copied from the chart of Peggy White.  Lactation Consultant Discharge Instructions   Congratulations on the birth of your baby and your decision to provide breast milk!  We applaud your dedication to provide your baby with the benefits of your milk, below find some instructions to help with the transition to home with your new baby.  Feeding Instructions   Infant Weight:    Birth Weight: 3636 g (8 lb 0.3 oz)     Today's Wt: 3395 g (7 lb 7.8 oz)   Percent Weight Loss: -6.63 %  I/O Attempts at Breast Breastfeeding Expressed   Breast Milk Formula Wet Diapers Stools   Last 24 hrs 0 10 0 0 5 3   Follow-up with Lina Sayre, DO in 1 to 2 days  Lactation Warm Line Phone Number:  [phone]  [facility], please call if you have lactation concerns. A lactation consultant will return your call within 24 hours. Urgent or medication questions should be discussed with your baby's pediatrician.  Helpful Tips   BREASTFEEDING:  Frequency is important so continue to aim for 8 to 12 times per 24 hours (or every 1-3 hours).  Keep baby deeply latched to prevent sore nipples and this will increase your milk transfer to baby.  Pump if your baby is not vigorous at transferring milk.  Remember to stimulate breasts at least 8 times per day with either baby to breast or pumping to establish and maintain your milk supply.  Once the baby latches, nurse until baby shows signs of satisfaction. This may VARY UP TO 20 MINUTES A BREAST. Watch for a rhythmic pattern:  4 or more sucks, pause of less than 10 seconds, no sounds other than swallowing, jaw movement with wiggle of ear or temple area and no dimpling in of cheeks.   ** NOTE: If baby does not nurse at least a total of 10 minutes, use waking techniques and relatch onto breast or try switch nursing from one breast to the other and keep baby vigorous during feeding.  FEEDING CUES:  Hand to mouth movements, stretching movements, making sounds, lip smacking, licking, and  rapid eye movement under closed lids.   ** NOTE: Crying is a late cue, baby may need to be calmed before he/she will latch  WAKING TECHNIQUES:  Unwrap and/or undress baby, change diaper, rock baby foward & back, stroke baby's back, stroke baby's lips with your fingers or baby's fingers.   **NOTE: If baby does not wake up within 20 minutes, try again in 1 hour or sooner if baby shows feeding cues.  BREAST ENGORGEMENT:  When milk supply increases, breasts may become firm and baby could have a difficult time latching.  You may manually express or pump 3 to 5 minutes, then breastfeed your baby.  If your breasts ache you may apply ice/cold packs for 15 - 20 minutes after breastfeeding.  Wear a supportive bra for comfort.    BREAST HAND EXPRESSION:  (see [facility] book)  PREVENTION AND TREATMENT OF SORE NIPPLES:  Express a couple drops of colostrum before latch, stroke tip of nipple against baby's lips to obtain a wide root. Make sure to bring baby in for a deeper, centered latch to prevent sore nipples. Allow colostrum to air dry onto nipples. Remember to release suction with your finger before removing baby.  Peggy Iles, RN  Lactation Consultant     Addendum:  Mother stated baby is breast feeding very well, denies difficulty.  Stage II breast care reviewed and d/c instructions given.

## 2015-07-18 NOTE — Discharge Summary (Signed)
Name: Peggy White MRN: [id number] DOB: May 23, 1986     Admit Date: 07/15/2015   Date of Discharge: 07/18/2015    Patient was accepted for discharge to   Home or [facility] [1]           Discharge Attending Physician: Jeralyn Bennett      Hospitalization Summary    CONCISE NARRATIVE: 30 year old G1P0 admitted for elective induction of labor. Patient's pregnancy risk factors include none. She is now s/p spontaneous vaginal delivery over intact perineum to live female infant, with Apgars of 9 and 9. Head delivered in LOA position.  Patient had a median episiotomy, repaired with 3-0 vicryl and local anesthesia in the usual fashion. Placenta delivered spontaneously intact with 3 vessel cord. Fundus firmed with vigorous massage. Misoprostol 800 mcg, methergine 0.2 mg IM was administered with good effect on bleeding.  EBL 400. Patient and infant tolerated delivery well.          Signed: Yonah Tangeman, MD  On: 07/18/2015  at: 7:50 AM

## 2015-07-18 NOTE — Progress Notes (Signed)
OBSTETRICS VAGINAL DELIVERY PROGRESS NOTE   Postpartum Day: 2    Subjective     Peggy White is doing well. Pain is well-controlled with current regimen. Tolerating regular diet without nausea/vomiting. She has ambulated. Denies chest pain, SOB, or lightheadedness. Appropriate vaginal bleeding. Baby is doing well, currently skin to skin with mom.      Objective     Vitals:    07/17/15 0805 07/17/15 1200 07/17/15 1555 07/18/15 0058   BP: 101/59 102/60 112/64 95/51   BP Location: Right arm   Right arm   Pulse: 66 70 70 67   Resp: 18 20 20 18    Temp: 36.3 C (97.3 F) 36.9 C (98.4 F) 36.9 C (98.4 F) 36.4 C (97.5 F)   TempSrc: Temporal Temporal Temporal Temporal   SpO2:       Weight:       Height:           Mental Status: Alert and oriented x 3  Cardiovascular: regular rate  Respiratory: normal work of breathing on room air  Abdomen: soft, appropriately tender. Non-distended. Fundus at umbilicus -2  Neurological: grossly normal      Labs       Recent Labs  Lab 07/17/15  0537 07/15/15  1726   HEMATOCRIT 27* 33*       ABO RH BLOOD TYPE (no units)   Date Value   07/15/2015 O RH POS                  RUBELLA IGG AB (no units)   Date Value   12/13/2014 Immune         Assessment & Plan     Peggy White is a 30 y.o. G1P1001 on PPD# 2 s/p Vaginal, Spontaneous Delivery  at [redacted]w[redacted]d Doing well.    Routine postpartum care:  - Rh status as above. Rhogam is not indicated.   - Infant: female.  - Feeding: breast  - PPBC: per attending  - PPHct as above. Ferrous sulfate is  on discharge.  - Immunizations: MMR is not indicated prior to discharge      Disposition: Plan for D/C home PPD # 2.    Dhriti Fales, MD

## 2015-08-22 ENCOUNTER — Observation Stay: Admission: AD | Admit: 2015-08-22 | Payer: Self-pay | Source: Ambulatory Visit | Admitting: Obstetrics

## 2015-09-16 ENCOUNTER — Ambulatory Visit: Payer: Self-pay | Admitting: Optometry

## 2015-09-16 ENCOUNTER — Encounter: Payer: Self-pay | Admitting: Optometry

## 2015-09-16 DIAGNOSIS — H5213 Myopia, bilateral: Secondary | ICD-10-CM

## 2015-09-17 NOTE — Progress Notes (Signed)
Outpatient Visit      Patient name: Peggy White  DOB: [birth year]       Age: 30 y.o.  MR#: [id number]    Encounter Date: 09/16/2015    Subjective:      Chief Complaint   Patient presents with    Annual Exam     HPI     Annual Exam/SCL wearer, LEE 1 yr Pt only wants Rx for glasses today, says   she has plenty of CL's left. VA stable per pt, no changes DV or NV OU. No   eye pain, irritation, use of gtts. No DM.       Last edited by Ashok Cordia, OD on 09/16/2015 11:32 AM.     has a current medication list which includes the following prescription(s): acetaminophen, docusate sodium, ibuprofen, and electric breast pump.     has No Known Allergies (drug, envir, food or latex).      History reviewed. No pertinent past medical history.   Past Surgical History:   Procedure Laterality Date    LUMBAR LAMINECTOMY  2007    At [facility] - L4/L5        Specialty Problems        Ophthalmology Problems    Myopia, bilateral               ROS     Positive for: Eyes    Last edited by Hauver, Amy on 09/16/2015 11:24 AM. (History)         Objective:     Base Eye Exam     Visual Acuity (Snellen - Linear)      Right Left   Dist cc 20/20 20/20   Near cc J1+ OU       Correction:  Contacts      Pupils      Pupils APD   Right PERRLA None   Left PERRLA None         Visual Fields      Left Right   Result Full Full         Extraocular Movement      Right Left   Result Full Full         Neuro/Psych     Oriented x3:  Yes    Mood/Affect:  Normal            Slit Lamp and Fundus Exam     External Exam      Right Left    External Normal ocular adnexae, lacrimal gland & drainage, orbits Normal ocular adnexae, lacrimal gland & drainage, orbits      Slit Lamp Exam      Right Left    Lids/Lashes Normal structure & position Normal structure & position    Conjunctiva/Sclera Normal bulbar/palpebral, conjunctiva, sclera Normal bulbar/palpebral, conjunctiva, sclera    Cornea Normal epithelium, stroma, endothelium, tear film Normal epithelium, stroma, endothelium,  tear film    Anterior Chamber Clear & deep Clear & deep    Iris Normal shape, size, morphology Normal shape, size, morphology    Lens Normal cortex, nucleus, anterior/posterior capsule, clarity Normal cortex, nucleus, anterior/posterior capsule, clarity    Vitreous Clear Clear            Refraction     Manifest Refraction      Sphere Cylinder Dist   Right -3.00 ds 20/20   Left -3.25 ds 20/20         Final Rx      Sphere Cylinder  Right -3.00 ds   Left -3.25 ds       Type:  SVL    Expiration Date:  09/16/2017            Contact Lens Exam     Current Contact Lens Rx      Brand Base Curve Sphere   Right Alcon Vison Care: Air Optix Aqua 8.60 -3.00   Left Alcon Vison Care: Air Optix Aqua 8.60 -3.00         Final Contact Lens Rx      Brand Base Curve Sphere   Right Alcon Vison Care: Air Optix Aqua 8.60 -3.00   Left Alcon Vison Care: Air Optix Aqua 8.60 -3.00       Expiration Date:  09/16/2016    Replacement:  Monthly      Final Contact Lens Rx #2      Brand Base Curve Sphere   Right Alcon Vison Care: Air Optix plus Hydraglyde 8.60 -3.00   Left Alcon Vison Care: Air Optix plus Hydraglyde 8.60 -3.00       Expiration Date:  09/16/2016    Replacement:  Monthly    Solutions:  BioTrue              Final Rx      Sphere Cylinder   Right -3.00 ds   Left -3.25 ds       Type:  SVL    Expiration Date:  09/16/2017        Final Contact Lens Rx      Brand Base Curve Sphere   Right Alcon Vison Care: Air Optix Aqua 8.60 -3.00   Left Alcon Vison Care: Air Optix Aqua 8.60 -3.00       Expiration Date:  09/16/2016    Replacement:  Monthly      Final Contact Lens Rx #2      Brand Base Curve Sphere   Right Alcon Vison Care: Air Optix plus Hydraglyde 8.60 -3.00   Left Alcon Vison Care: Air Optix plus Hydraglyde 8.60 -3.00       Expiration Date:  09/16/2016    Replacement:  Monthly    Solutions:  BioTrue              No annotated images are attached to the encounter.      Assessment/Plan:      1. Myopia, bilateral           PLAN:  1.  New spectacle Rx given  today for full time wear.  Monitor yearly or sooner PRN if visual problems noted.    Successful soft CL rx renewed for Ai optix Aqua or plus Hydraglyde (trials given) 8.6/-3.00 OD and -3.25 OS.  Good fit, comfort, movement and vision.  CL trials and rx dispensed.  Encouraged monthly replacement and to remove nightly before bed but ok for extended wear up to one week.  Pt to call/rtc if changes noted.  Monitor yearly.  Undilated ocular health exam was within normal limits OU today.  The patient deferred dilation and was educated on potential ocular issues not addressed on undilated exam (peripheral retinal disease, holes, tears etc.) and to call/RTC asap if notes any flashes, new floaters or visual changes.  Otherwise monitor in 1-2 years with dilated exam.

## 2017-07-13 NOTE — L&D Delivery Note (Signed)
Date of delivery: 06/12/18 Estimated Date of Delivery: 06/16/18 Patient's last menstrual period was 09/09/2017. EGA: 1811w3d  Delivery Note At 10:03 PM a viable female was delivered via Vaginal, Spontaneous (Presentation: cephalic; ROP).  APGAR: 8, 9; weight: pending.   Placenta status: spontaneous, intact.  Cord: thick, loose coil, 3 vessels, with the following complications: none apparent.  Cord pH: not collected  Anesthesia:  None for delivery (unable to place epidural), lidocaine for repair Episiotomy:  no Lacerations:  2nd degree Suture Repair: 3.0 vicryl rapide Est. Blood Loss (mL):  290cc measured  Mom presented to L&D with labor.  epidual attempted to be placed, but was technically difficult and abandoned as patient had the urge to push. Progressed to complete, second stage: <10 mins. delivery of fetal head with restitution to ROT.   Anterior then posterior shoulders delivered without difficulty.  Baby placed on mom's chest, and attended to by peds.  Cord was then clamped and cut by FOB when pulseless.  Placenta spontaneously delivered, intact.   IV pitocin given for hemorrhage prophylaxis. Shallow 2nd degree repaired in standard fashion using 3-0 vicryl rapide. Breastfeeding started while skin to skin.  We sang happy birthday to baby [name].   Mom to postpartum.  Baby to Couplet care / Skin to Skin.  Stephanie Gill Stephanie Gill 06/12/2018, 10:31 PM

## 2017-11-24 ENCOUNTER — Other Ambulatory Visit: Payer: Self-pay | Admitting: Certified Nurse Midwife

## 2017-11-24 DIAGNOSIS — Z369 Encounter for antenatal screening, unspecified: Secondary | ICD-10-CM

## 2017-12-09 ENCOUNTER — Ambulatory Visit
Admission: RE | Admit: 2017-12-09 | Discharge: 2017-12-09 | Disposition: A | Discharge status: Home / Self Care | Payer: Self-pay | Source: Ambulatory Visit | Attending: Maternal & Fetal Medicine | Admitting: Maternal & Fetal Medicine

## 2017-12-09 ENCOUNTER — Encounter: Payer: Self-pay | Admitting: *Deleted

## 2017-12-09 ENCOUNTER — Ambulatory Visit (HOSPITAL_BASED_OUTPATIENT_CLINIC_OR_DEPARTMENT_OTHER)
Admission: RE | Admit: 2017-12-09 | Discharge: 2017-12-09 | Disposition: A | Discharge status: Home / Self Care | Payer: Self-pay | Source: Ambulatory Visit | Attending: Maternal & Fetal Medicine | Admitting: Maternal & Fetal Medicine

## 2017-12-09 VITALS — BP 115/68 | HR 91 | Temp 98.2°F | Resp 18 | Ht 67.2 in | Wt 209.8 lb

## 2017-12-09 DIAGNOSIS — Z3689 Encounter for other specified antenatal screening: Secondary | ICD-10-CM | POA: Insufficient documentation

## 2017-12-09 DIAGNOSIS — Z806 Family history of leukemia: Secondary | ICD-10-CM | POA: Insufficient documentation

## 2017-12-09 DIAGNOSIS — Z36 Encounter for antenatal screening for chromosomal anomalies: Secondary | ICD-10-CM

## 2017-12-09 DIAGNOSIS — Z369 Encounter for antenatal screening, unspecified: Secondary | ICD-10-CM

## 2017-12-09 NOTE — Progress Notes (Signed)
Pt seen by me, agree with assessment and plan as outlined in CGC Wells's note 

## 2017-12-09 NOTE — Progress Notes (Signed)
Referring physician:  [facility] Length of Consultation: 30 minutes   Ms. Tamm  was referred to [facility] for genetic counseling to review prenatal screening and testing options.  This note summarizes the information we discussed.    We offered the following routine screening tests for this pregnancy:  First trimester screening, which includes nuchal translucency ultrasound screen and first trimester maternal serum marker screening.  The nuchal translucency has approximately an 80% detection rate for Down syndrome and can be positive for other chromosome abnormalities as well as congenital heart defects.  When combined with a maternal serum marker screening, the detection rate is up to 90% for Down syndrome and up to 97% for trisomy 18.     Maternal serum marker screening, a blood test that measures pregnancy proteins, can provide risk assessments for Down syndrome, trisomy 18, and open neural tube defects (spina bifida, anencephaly). Because it does not directly examine the fetus, it cannot positively diagnose or rule out these problems.  Targeted ultrasound uses high frequency sound waves to create an image of the developing fetus.  An ultrasound is often recommended as a routine means of evaluating the pregnancy.  It is also used to screen for fetal anatomy problems (for example, a heart defect) that might be suggestive of a chromosomal or other abnormality.   Should these screening tests indicate an increased concern, then the following additional testing options would be offered:  The chorionic villus sampling procedure is available for first trimester chromosome analysis.  This involves the withdrawal of a small amount of chorionic villi (tissue from the developing placenta).  Risk of pregnancy loss is estimated to be approximately 1 in 200 to 1 in 100 (0.5 to 1%).  There is approximately a 1% (1 in 100) chance that the CVS chromosome results will  be unclear.  Chorionic villi cannot be tested for neural tube defects.     Amniocentesis involves the removal of a small amount of amniotic fluid from the sac surrounding the fetus with the use of a thin needle inserted through the maternal abdomen and uterus.  Ultrasound guidance is used throughout the procedure.  Fetal cells from amniotic fluid are directly evaluated and > 99.5% of chromosome problems and > 98% of open neural tube defects can be detected. This procedure is generally performed after the 15th week of pregnancy.  The main risks to this procedure include complications leading to miscarriage in less than 1 in 200 cases (0.5%).  As another option for information if the pregnancy is suspected to be an an increased chance for certain chromosome conditions, we also reviewed the availability of cell free fetal DNA testing from maternal blood to determine whether or not the baby may have either Down syndrome, trisomy 74, or trisomy 32.  This test utilizes a maternal blood sample and DNA sequencing technology to isolate circulating cell free fetal DNA from maternal plasma.  The fetal DNA can then be analyzed for DNA sequences that are derived from the three most common chromosomes involved in aneuploidy, chromosomes 13, 18, and 21.  If the overall amount of DNA is greater than the expected level for any of these chromosomes, aneuploidy is suspected.  While we do not consider it a replacement for invasive testing and karyotype analysis, a negative result from this testing would be reassuring, though not a guarantee of a normal chromosome complement for the baby.  An abnormal result is certainly suggestive of an abnormal chromosome complement,  though we would still recommend CVS or amniocentesis to confirm any findings from this testing.  Cystic Fibrosis and Spinal Muscular Atrophy (SMA) screening were also discussed with the patient. Both conditions are recessive, which means that both parents must be  carriers in order to have a child with the disease.  Cystic fibrosis (CF) is one of the most common genetic conditions in persons of Caucasian ancestry.  This condition occurs in approximately 1 in 2,500 Caucasian persons and results in thickened secretions in the lungs, digestive, and reproductive systems.  For a baby to be at risk for having CF, both of the parents must be carriers for this condition.  Approximately 1 in 64 Caucasian persons is a carrier for CF.  Current carrier testing looks for the most common mutations in the gene for CF and can detect approximately 90% of carriers in the Caucasian population.  This means that the carrier screening can greatly reduce, but cannot eliminate, the chance for an individual to have a child with CF.  If an individual is found to be a carrier for CF, then carrier testing would be available for the partner. As part of [place]'s newborn screening profile, all babies born in the state of [place] will have a two-tier screening process.  Specimens are first tested to determine the concentration of immunoreactive trypsinogen (IRT).  The top 5% of specimens with the highest IRT values then undergo DNA testing using a panel of over 40 common CF mutations. SMA is a neurodegenerative disorder that leads to atrophy of skeletal muscle and overall weakness.  This condition is also more prevalent in the Caucasian population, with 1 in 40-1 in 60 persons being a carrier and 1 in 6,000-1 in 10,000 children being affected.  There are multiple forms of the disease, with some causing death in infancy to other forms with survival into adulthood.  The genetics of SMA is complex, but carrier screening can detect up to 95% of carriers in the Caucasian population.  Similar to CF, a negative result can greatly reduce, but cannot eliminate, the chance to have a child with SMA.  We obtained a detailed family history and pregnancy history.  The patient reported that her maternal  half sister was born with very small tags of skin on her outer hands which could possibly represent mild polydactyly.  She is otherwise in good health, with normal growth and development at 33 years of age.  We reviewed that polydactyly may be part of many genetic syndromes, but may also occur as an isolated birth difference in cases where there are no other health or developmental differences.  The patient also reported that her brother was diagnosed at age 42 with AML and the father of the baby has a family history of cancer in his maternal aunt (breast) and her daughter (breast), maternal uncle (prostate) and maternal grandfather (prostate).  We discussed that the vast majority of cancers occur by chance, but that in some families there may be an inherited component which predisposes to the development of cancer.  If anyone in the family is concerned about this history, we are happy to put them in contact with a cancer genetic counselor.  The remainder of the family history was reported to be unremarkable for birth defects, intellectual delays, recurrent pregnancy loss or known chromosome abnormalities.  Ms. Boak stated that this is the second pregnancy for she and her husband. They have a healthy 53 year old daughter.  She reported no complications  or exposures in this pregnancy that would be expected to increase the risk for birth defects.  After consideration of the options, Ms. Tortorelli elected to proceed with MaterniT21 PLUS with SCA, cystic fibrosis and SMA carrier screening. 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  For this reason, she opted to bypass first trimester screening and proceed with the more accurate cell free fetal DNA testing to assess for aneuploidy.  An ultrasound was performed at the time of the visit.  The gestational age was consistent with 13 weeks.  Fetal anatomy could not be assessed due to early  gestational age.  Please refer to the ultrasound report for details of that study.  Ms. Shropshire was encouraged to call with questions or concerns.  We can be contacted at [phone].   Tests ordered:  MaterniT21 PLUS with SCA, cystic fibrosis, SMA carrier screening  Cherly Anderson, MS, CGC

## 2017-12-15 LAB — SMN1 COPY NUMBER ANALYSIS (SMA CARRIER SCREENING)

## 2017-12-15 LAB — MATERNIT21 PLUS CORE+SCA
CHROMOSOME 18: NEGATIVE
Chromosome 13: NEGATIVE
Chromosome 21: NEGATIVE
Y Chromosome: NOT DETECTED

## 2017-12-16 ENCOUNTER — Telehealth: Payer: Self-pay | Admitting: Obstetrics and Gynecology

## 2017-12-16 LAB — CYSTIC FIBROSIS GENE TEST

## 2017-12-16 NOTE — Telephone Encounter (Signed)
Stephanie Gill elected to have MaterniT21 testing, carrier screening for cystic fibrosis and SMA at her genetic counseling visit on 12/09/2017. Results are now available and have been communicated to the patient as follows:  The patient was informed of the results of her recent MaterniT21 testing which yielded NEGATIVE results.  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  The sensitivity for trisomy 6921, trisomy 5918 and trisomy 613 using this testing are reported as 99.1%, 99.9% and 91.7% respectively.  Thus, while the results of this testing are highly accurate, they are not considered diagnostic at this time.  Should more definitive information be desired, the patient may still consider amniocentesis.   As requested to know by the patient, sex chromosome analysis was included for this sample.  The gender information was placed in an envelope for the patient to pick up as requested. This is predicted with >99% accuracy.  A maternal serum AFP only should be considered if screening for neural tube defects is desired.  CF is a genetic condition that occurs most often in Caucasian persons.  It primarily affects the lungs, digestive, and reproductive systems.  For someone to be at risk for having CF, both of their parents must be carriers for CF.  The testing can detect many persons who are carriers for CF and therefore determine if the pregnancy is at an increased risk for this condition.    The blood test results were negative when examined for the 32 most common mutations (or changes) in the gene for CF.  This means that she does not carry any of the most common changes in this gene.  Testing for these 32 mutations detects approximately 90% of carriers who are Caucasian.  Therefore, the chance that she is a carrier based on this negative result has been reduced from 1 in 25 to approximately 1 in 240.  Because this testing cannot detect all changes that may cause CF, we  cannot eliminate the chance that this individual is a carrier completely.  The results of the SMA carrier screening are also available.  SMA is also a recessive genetic condition with variable age of onset and severity caused by mutations in the SMN1 gene.  This carrier testing assesses the number of copies of this gene.  Persons with one copy of the SMN1 gene are carriers, and those with no copies are affected with the condition.  Individuals with two or more copies have a reduced chance to be a carrier.  Not all mutations can be detected with this testing, though it can detect 94.8% of carriers in the Caucasian population.  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  Again, this testing cannot eliminate the chance to have a child with SMA, but dramatically reduces the chance.    We may be reached at [phone] with any questions or concerns.   Cherly Andersoneborah F. Elgie Maziarz, MS, CGC

## 2018-03-31 ENCOUNTER — Telehealth: Payer: Self-pay | Admitting: Obstetrics and Gynecology

## 2018-03-31 NOTE — Telephone Encounter (Signed)
Ms Stephanie Gill called today about the bill from her [facility] visit on 12/09/2017. We had spoken before about this bill, as there was initially confusion about the cost for labs.  The patient is a [employer] employee and therefore her labs are covered at no charge.  This was handled by our [employer] representative and cost is no longer on the [facility] bill.  The patient is, however, still being billed for the ultrasound portion of this visit and after I spoke with Wendie AgresteJennifer Rudd in billing, I was able to explain to the patient that the remaining charges are ultrasound only, not any lab fees.  She has my contact information to call if there are more questions.  Cherly Andersoneborah F. Moniqua Engebretsen, MS, CGC

## 2018-06-12 ENCOUNTER — Other Ambulatory Visit: Payer: Self-pay

## 2018-06-12 ENCOUNTER — Inpatient Hospital Stay
Admission: EM | Admit: 2018-06-12 | Discharge: 2018-06-14 | DRG: 807 | Disposition: A | Discharge status: Home / Self Care | Payer: Managed Care, Other (non HMO) | Attending: Obstetrics & Gynecology | Admitting: Obstetrics & Gynecology

## 2018-06-12 DIAGNOSIS — Z3A39 39 weeks gestation of pregnancy: Secondary | ICD-10-CM | POA: Diagnosis not present

## 2018-06-12 DIAGNOSIS — D649 Anemia, unspecified: Secondary | ICD-10-CM | POA: Diagnosis present

## 2018-06-12 DIAGNOSIS — O9902 Anemia complicating childbirth: Principal | ICD-10-CM | POA: Diagnosis present

## 2018-06-12 DIAGNOSIS — O99214 Obesity complicating childbirth: Secondary | ICD-10-CM | POA: Diagnosis present

## 2018-06-12 DIAGNOSIS — E669 Obesity, unspecified: Secondary | ICD-10-CM | POA: Diagnosis present

## 2018-06-12 DIAGNOSIS — Z3483 Encounter for supervision of other normal pregnancy, third trimester: Secondary | ICD-10-CM | POA: Diagnosis present

## 2018-06-12 HISTORY — DX: Anemia, unspecified: D64.9

## 2018-06-12 LAB — CBC
HCT: 34.4 % — ABNORMAL LOW (ref 36.0–46.0)
HEMOGLOBIN: 10.8 g/dL — AB (ref 12.0–15.0)
MCH: 22.5 pg — ABNORMAL LOW (ref 26.0–34.0)
MCHC: 31.4 g/dL (ref 30.0–36.0)
MCV: 71.5 fL — ABNORMAL LOW (ref 80.0–100.0)
NRBC: 0 % (ref 0.0–0.2)
PLATELETS: 369 10*3/uL (ref 150–400)
RBC: 4.81 MIL/uL (ref 3.87–5.11)
RDW: 17.4 % — ABNORMAL HIGH (ref 11.5–15.5)
WBC: 11.4 10*3/uL — ABNORMAL HIGH (ref 4.0–10.5)

## 2018-06-12 LAB — OB RESULTS CONSOLE GC/CHLAMYDIA
Chlamydia: NEGATIVE
Gonorrhea: NEGATIVE

## 2018-06-12 LAB — OB RESULTS CONSOLE GBS: GBS: NEGATIVE

## 2018-06-12 LAB — OB RESULTS CONSOLE RUBELLA ANTIBODY, IGM: Rubella: IMMUNE

## 2018-06-12 LAB — OB RESULTS CONSOLE RPR: RPR: NONREACTIVE

## 2018-06-12 LAB — OB RESULTS CONSOLE HEPATITIS B SURFACE ANTIGEN: HEP B S AG: NEGATIVE

## 2018-06-12 LAB — OB RESULTS CONSOLE ABO/RH: RH Type: POSITIVE

## 2018-06-12 LAB — OB RESULTS CONSOLE HIV ANTIBODY (ROUTINE TESTING): HIV: NONREACTIVE

## 2018-06-12 LAB — OB RESULTS CONSOLE VARICELLA ZOSTER ANTIBODY, IGG: VARICELLA IGG: IMMUNE

## 2018-06-12 MED ORDER — OXYTOCIN 10 UNIT/ML IJ SOLN
INTRAMUSCULAR | Status: AC
  Filled 2018-06-12: qty 2

## 2018-06-12 MED ORDER — TERBUTALINE SULFATE 1 MG/ML IJ SOLN: 0.2500 mg | Freq: Once | INTRAMUSCULAR | Status: DC | PRN

## 2018-06-12 MED ORDER — LACTATED RINGERS IV SOLN: 500.0000 mL | INTRAVENOUS | Status: DC | PRN

## 2018-06-12 MED ORDER — LIDOCAINE HCL (PF) 1 % IJ SOLN
INTRAMUSCULAR | Status: AC
  Filled 2018-06-12: qty 30

## 2018-06-12 MED ORDER — ONDANSETRON HCL 4 MG/2ML IJ SOLN: 4.0000 mg | Freq: Four times a day (QID) | INTRAMUSCULAR | Status: DC | PRN

## 2018-06-12 MED ORDER — LACTATED RINGERS IV SOLN: INTRAVENOUS | Status: DC

## 2018-06-12 MED ORDER — OXYTOCIN 40 UNITS IN LACTATED RINGERS INFUSION - SIMPLE MED
2.5000 [IU]/h | INTRAVENOUS | Status: DC
  Filled 2018-06-12: qty 1000

## 2018-06-12 MED ORDER — BUTORPHANOL TARTRATE 1 MG/ML IJ SOLN
1.0000 mg | INTRAMUSCULAR | Status: DC | PRN
  Administered 2018-06-12: 1 mg via INTRAVENOUS

## 2018-06-12 MED ORDER — SOD CITRATE-CITRIC ACID 500-334 MG/5ML PO SOLN: 30.0000 mL | ORAL | Status: DC | PRN

## 2018-06-12 MED ORDER — MISOPROSTOL 200 MCG PO TABS
ORAL_TABLET | ORAL | Status: AC
  Filled 2018-06-12: qty 4

## 2018-06-12 MED ORDER — AMMONIA AROMATIC IN INHA
RESPIRATORY_TRACT | Status: AC
  Filled 2018-06-12: qty 10

## 2018-06-12 MED ORDER — BUTORPHANOL TARTRATE 2 MG/ML IJ SOLN
INTRAMUSCULAR | Status: AC
  Administered 2018-06-12: 1 mg via INTRAVENOUS
  Filled 2018-06-12: qty 1

## 2018-06-12 MED ORDER — OXYTOCIN 40 UNITS IN LACTATED RINGERS INFUSION - SIMPLE MED: 1.0000 m[IU]/min | INTRAVENOUS | Status: DC

## 2018-06-12 MED ORDER — OXYTOCIN BOLUS FROM INFUSION: 500.0000 mL | Freq: Once | INTRAVENOUS | Status: DC

## 2018-06-12 MED ORDER — ACETAMINOPHEN 500 MG PO TABS: 1000.0000 mg | ORAL_TABLET | Freq: Four times a day (QID) | ORAL | Status: DC | PRN

## 2018-06-12 MED ORDER — FENTANYL 2.5 MCG/ML W/ROPIVACAINE 0.15% IN NS 100 ML EPIDURAL (ARMC)
EPIDURAL | Status: AC
  Filled 2018-06-12: qty 100

## 2018-06-12 MED ORDER — IBUPROFEN 600 MG PO TABS
600.0000 mg | ORAL_TABLET | Freq: Four times a day (QID) | ORAL | Status: DC
  Administered 2018-06-12 – 2018-06-14 (×6): 600 mg via ORAL
  Filled 2018-06-12 (×6): qty 1

## 2018-06-12 MED ORDER — LIDOCAINE HCL (PF) 1 % IJ SOLN: 30.0000 mL | INTRAMUSCULAR | Status: DC | PRN

## 2018-06-12 NOTE — OB Triage Note (Signed)
Pt presents to L&D with c/o contractions all day worsening around 1900. Pt denies LOF or vaginal bleeding, reports good fetal movent. EFM and toco applied and explained. Plan to monitor for labor.

## 2018-06-12 NOTE — H&P (Signed)
OB History & Physical   History of Present Illness:  Chief Complaint:   HPI:  Stephanie Gill is a 32 y.o. G2P0 female at 1643w3d dated by Patient's last menstrual period was 09/09/2017. Estimated Date of Delivery: 06/16/18   She presents to L&D with worsening contractions  +FM, + CTX, no LOF, no VB  Pregnancy Issues: 1. bmi 33 2. Anemia 3. History of precipitous labor  Maternal Medical History:   Past Medical History:  Diagnosis Date  . Anemia     Past Surgical History:  Procedure Laterality Date  . LAMINECTOMY      No Known Allergies  Prior to Admission medications   Medication Sig Start Date End Date Taking? Authorizing Provider  Prenatal Vit-Fe Fumarate-FA (PRENATAL MULTIVITAMIN) TABS tablet Take 1 tablet by mouth daily at 12 noon.    [provider]     [facility]   Social History: She  reports that she has never smoked. She has never used smokeless tobacco. She reports that she drank alcohol. She reports that she does not use drugs.  Family History: family history is not on file.   Review of Systems: A full review of systems was performed and negative except as noted in the HPI.     Physical Exam:  Vital Signs: BP 136/86   Pulse 68   Temp 98.1 F (36.7 C) (Oral)   Resp 18   Ht 5\' 7"  (1.702 m)   Wt 106.1 kg   BMI 36.65 kg/m  General: no acute distress.  HEENT: normocephalic, atraumatic Heart: regular rate & rhythm.  No murmurs/rubs/gallops Lungs: clear to auscultation bilaterally, normal respiratory effort Abdomen: soft, gravid, non-tender;  EFW: 7.11 Pelvic:   External: Normal external female genitalia  Cervix: Dilation: 5 / Effacement (%): 80 / Station: -1    Extremities: non-tender, symmetric, 1+ edema bilaterally.  DTRs: 2+  Neurologic: Alert & oriented x 3.    No results found for this or any previous visit (from the past 24 hour(s)).  Pertinent Results:  Prenatal Labs: Blood type/Rh O+  Antibody  screen neg  Rubella Immune  Varicella Immune  RPR NR  HBsAg Neg  HIV NR  GC neg  Chlamydia neg  Genetic screening negative  1 hour GTT 134  3 hour GTT --  GBS negative   FHT: 120 mod +accels no decels TOCO: q473min SVE:  Dilation: 5 / Effacement (%): 80 / Station: -1    Cephalic by leopolds   Assessment:  Stephanie Gill is a 32 y.o. G2P0 female at 3843w3d with labor.   Plan:  1. Admit to Labor & Delivery 2. CBC, T&S, Clrs, IVF 3. GBS neg  4. Consents obtained. 5. Continuous efm/toco 6. Category 1 7. Epidural requested, await labs 8. Anticipate vaginal delivery3  ----- Ranae Plumberhelsea Ward, MD Attending Obstetrician and Gynecologist [facility], Department of OB/GYN [facility]

## 2018-06-12 NOTE — Discharge Summary (Signed)
Obstetrical Discharge Summary  Patient Name: Stephanie Gill DOB: 04/20/1986 MRN: [id number]  Date of Admission: 06/12/2018 Date of Delivery: 06/12/18 Delivered by: Ranae Plumberhelsea Ward, MD Date of Discharge: 06/14/2018  [facility]  WUJ:[id number]'WLMP:Patient's last menstrual period was 09/09/2017. EDC Estimated Date of Delivery: 06/16/18 Gestational Age at Delivery: 5633w3d   Antepartum complications:  1. Anemia 2. Obesity 3. History of precipitous delivery  Admitting Diagnosis: labor Secondary Diagnosis: Patient Active Problem List   Diagnosis Date Noted  . Labor and delivery indication for care or intervention 06/12/2018    Augmentation: none Complications: None Intrapartum complications/course: Mom presented to L&D with labor.  epidual attempted to be placed, but was technically difficult and abandoned as patient had the urge to push. Progressed to complete, second stage: <10 mins. delivery of fetal head with restitution to ROT.   Anterior then posterior shoulders delivered without difficulty.  Baby placed on mom's chest, and attended to by peds.  Cord was then clamped and cut by FOB when pulseless.  Placenta spontaneously delivered, intact.   IV pitocin given for hemorrhage prophylaxis. Shallow 2nd degree repaired in standard fashion using 3-0 vicryl rapide. Breastfeeding started while skin to skin. Date of Delivery: 06/12/18 Delivered By: Leeroy Bockhelsea Ward Delivery Type: spontaneous vaginal delivery Anesthesia: none Placenta: spontaneous Laceration: 2nd degree Episiotomy: none Newborn Data: Live born female "[name]" Birth Weight:  3740g 8lb 3.9oz APGAR: 8, 9  Newborn Delivery   Birth date/time:  06/12/2018 22:03:00 Delivery type:  Vaginal, Spontaneous       Postpartum Procedures: none  Post partum course:  Patient had an uncomplicated postpartum course.  By time of discharge on PPD#2, her pain was controlled on oral pain medications; she had appropriate lochia and was  ambulating, voiding without difficulty and tolerating regular diet.  She was deemed stable for discharge to home.    Discharge Physical Exam:  BP 117/81   Pulse (!) 59   Temp 98 F (36.7 C) (Oral)   Resp 20   Ht 5\' 7"  (1.702 m)   Wt 106.1 kg   LMP 09/09/2017   SpO2 98%   Breastfeeding? Unknown   BMI 36.65 kg/m   General: NAD CV: RRR Pulm: CTABL, nl effort ABD: s/nd/nt, fundus firm and below the umbilicus Lochia: moderate DVT Evaluation: LE non-ttp, no evidence of DVT on exam.  Hemoglobin  Date Value Ref Range Status  06/13/2018 9.8 (L) 12.0 - 15.0 g/dL Final   HCT  Date Value Ref Range Status  06/13/2018 31.5 (L) 36.0 - 46.0 % Final     Disposition: stable, discharge to home. Baby Feeding: breastmilk Baby Disposition: home with mom  Rh Immune globulin given: n/a Rubella vaccine given: n/a Tdap vaccine given in AP or PP setting: AP Flu vaccine given in AP or PP setting: AP  Contraception: POP then vasectomy  Prenatal Labs:  Blood type/Rh O+  Antibody screen neg  Rubella Immune  Varicella Immune  RPR NR  HBsAg Neg  HIV NR  GC neg  Chlamydia neg  Genetic screening negative  1 hour GTT 134  3 hour GTT --  GBS negative      Plan:  Stephanie Gill was discharged to home in good condition. Follow-up appointment with Dr. Elesa MassedWard in 6 weeks.  Discharge Medications: Allergies as of 06/14/2018   No Known Allergies     Medication List    TAKE these medications   acetaminophen 325 MG tablet Commonly known as:  TYLENOL Take 2 tablets (650 mg total) by  mouth every 6 (six) hours as needed for mild pain or moderate pain.   ibuprofen 600 MG tablet Commonly known as:  ADVIL,MOTRIN Take 1 tablet (600 mg total) by mouth every 6 (six) hours as needed for mild pain, moderate pain or cramping.   prenatal multivitamin Tabs tablet Take 1 tablet by mouth daily at 12 noon.       Follow-up Information    Ward, Elenora Fender, MD Follow up in 6 week(s).   Specialty:   Obstetrics and Gynecology Why:  routine postpartum care.  call anytime if you feel you need to be seen sooner. Contact information: [address] [place] [address] [phone]           Signed: Genia Del, CNM 06/14/2018 8:22 AM

## 2018-06-13 LAB — CBC
HCT: 31.5 % — ABNORMAL LOW (ref 36.0–46.0)
Hemoglobin: 9.8 g/dL — ABNORMAL LOW (ref 12.0–15.0)
MCH: 22.6 pg — ABNORMAL LOW (ref 26.0–34.0)
MCHC: 31.1 g/dL (ref 30.0–36.0)
MCV: 72.7 fL — ABNORMAL LOW (ref 80.0–100.0)
Platelets: 342 10*3/uL (ref 150–400)
RBC: 4.33 MIL/uL (ref 3.87–5.11)
RDW: 17.2 % — ABNORMAL HIGH (ref 11.5–15.5)
WBC: 19 10*3/uL — ABNORMAL HIGH (ref 4.0–10.5)
nRBC: 0 % (ref 0.0–0.2)

## 2018-06-13 LAB — TYPE AND SCREEN
ABO/RH(D): O POS
Antibody Screen: NEGATIVE

## 2018-06-13 MED ORDER — WITCH HAZEL-GLYCERIN EX PADS: 1.0000 "application " | MEDICATED_PAD | CUTANEOUS | Status: DC

## 2018-06-13 MED ORDER — SIMETHICONE 80 MG PO CHEW: 80.0000 mg | CHEWABLE_TABLET | ORAL | Status: DC | PRN

## 2018-06-13 MED ORDER — ONDANSETRON HCL 4 MG PO TABS: 4.0000 mg | ORAL_TABLET | ORAL | Status: DC | PRN

## 2018-06-13 MED ORDER — BENZOCAINE-MENTHOL 20-0.5 % EX AERO: 1.0000 "application " | INHALATION_SPRAY | CUTANEOUS | Status: DC | PRN

## 2018-06-13 MED ORDER — PRENATAL MULTIVITAMIN CH
1.0000 | ORAL_TABLET | Freq: Every day | ORAL | Status: DC
  Administered 2018-06-13: 1 via ORAL
  Filled 2018-06-13: qty 1

## 2018-06-13 MED ORDER — ONDANSETRON HCL 4 MG/2ML IJ SOLN: 4.0000 mg | INTRAMUSCULAR | Status: DC | PRN

## 2018-06-13 MED ORDER — DIPHENHYDRAMINE HCL 25 MG PO CAPS: 25.0000 mg | ORAL_CAPSULE | Freq: Four times a day (QID) | ORAL | Status: DC | PRN

## 2018-06-13 MED ORDER — DOCUSATE SODIUM 100 MG PO CAPS
100.0000 mg | ORAL_CAPSULE | Freq: Two times a day (BID) | ORAL | Status: DC
  Administered 2018-06-13 (×2): 100 mg via ORAL
  Filled 2018-06-13 (×2): qty 1

## 2018-06-13 MED ORDER — HYDROCORTISONE 2.5 % RE CREA
TOPICAL_CREAM | RECTAL | Status: DC
  Filled 2018-06-13: qty 28.35

## 2018-06-13 MED ORDER — COCONUT OIL OIL
1.0000 "application " | TOPICAL_OIL | Status: DC | PRN
  Administered 2018-06-13: 1 via TOPICAL
  Filled 2018-06-13: qty 120

## 2018-06-13 MED ORDER — BENZOCAINE-MENTHOL 20-0.5 % EX AERO
INHALATION_SPRAY | CUTANEOUS | Status: AC
  Filled 2018-06-13: qty 56

## 2018-06-13 MED ORDER — ACETAMINOPHEN 500 MG PO TABS: 1000.0000 mg | ORAL_TABLET | Freq: Four times a day (QID) | ORAL | Status: DC | PRN

## 2018-06-13 NOTE — Lactation Note (Signed)
This note was copied from a baby's chart. Lactation Consultation Note  Patient Name: Stephanie Gill: 06/13/2018 Reason for consult: Follow-up assessment Observed another breast feed with [name] latching without assistance.  Mom still denying any pain with breast feeding.  Maternal Data Formula Feeding for Exclusion: No Has patient been taught Hand Expression?: Yes Does the patient have breastfeeding experience prior to this delivery?: Yes  Feeding Feeding Type: Breast Fed  LATCH Score Latch: Grasps breast easily, tongue down, lips flanged, rhythmical sucking.  Audible Swallowing: A few with stimulation  Type of Nipple: Everted at rest and after stimulation  Comfort (Breast/Nipple): Soft / non-tender  Hold (Positioning): No assistance needed to correctly position infant at breast.  LATCH Score: 9  Interventions Interventions: Breast compression;Support pillows;Coconut oil  Lactation Tools Discussed/Used Tools: Coconut oil WIC Program: No([employer])   Consult Status Consult Status: PRN Follow-up type: Call as needed    Louis MeckelWilliams, Lashell Moffitt Kay 06/13/2018, 6:39 PM

## 2018-06-13 NOTE — Lactation Note (Addendum)
This note was copied from a baby's chart. Lactation Consultation Note  Patient Name: Girl Edwena BlowKristen Gill WGNFA'OToday's Date: 06/13/2018   Mom requested lactation visit.  [name] is waking to breast feed every 1 1/2 hours to breast feed.  Mom had cracking and bleeding nipples with first baby in beginning for 1st couple of months but went on to successfully breast feed for 10 to 11 months.  Demonstrated hand expression and suggested mom rub expressed colostrum to nipples.  Coconut oil already given as well.  Observed her latch without assistancae.  Mom denies pain like she experienced with first baby at least so far.  She breast fed for 22 minutes with strong rhythmic sucking and swallows.  Reviewed supply and demand, normal course of lactation and routine newborn feeding patterns.  Lactation name and number written on white board and encouraged to call with any questions, concerns or assistance.  Maternal Data    Feeding    LATCH Score                   Interventions    Lactation Tools Discussed/Used     Consult Status      Louis MeckelWilliams, Collan Schoenfeld Kay 06/13/2018, 3:51 PM

## 2018-06-13 NOTE — Progress Notes (Addendum)
Post Partum Day 1 Subjective: I feel great and baby is nursing well  Objective: Blood pressure 118/80, pulse (!) 57, temperature 97.9 F (36.6 C), temperature source Oral, resp. rate 18, height 5\' 7"  (1.702 m), weight 106.1 kg, last menstrual period 09/09/2017, SpO2 100 %, unknown if currently breastfeeding.  Physical Exam:  General: alert and cooperative  HEART: S1S2, RRR, NO M/R/G LUNGS:CTA BILAT, NO W/R/R Lochia: MOD, no clots Uterine Fundus: firm,U-1 Incision: healing well, no significant drainage DVT Evaluation: No evidence of DVT seen on physical exam.  Recent Labs    06/12/18 2114 06/13/18 0507  HGB 10.8* 9.8*  HCT 34.4* 31.5*    Assessment/Plan: A;PPD#1 Plan for discharge tomorrow   LOS: 1 day   Sharee Pimplearon W Jones 06/13/2018, 7:53 AM

## 2018-06-14 LAB — RPR: RPR Ser Ql: NONREACTIVE

## 2018-06-14 MED ORDER — IBUPROFEN 600 MG PO TABS: 600.0000 mg | ORAL_TABLET | Freq: Four times a day (QID) | ORAL | 0 refills | Status: AC | PRN

## 2018-06-14 MED ORDER — ACETAMINOPHEN 325 MG PO TABS: 650.0000 mg | ORAL_TABLET | Freq: Four times a day (QID) | ORAL | 0 refills | Status: AC | PRN

## 2018-06-14 NOTE — Progress Notes (Signed)
Dc to home with baby to car with auxillary

## 2018-06-14 NOTE — Discharge Instructions (Signed)

## 2019-01-31 ENCOUNTER — Other Ambulatory Visit: Payer: Self-pay

## 2019-01-31 DIAGNOSIS — Z20822 Contact with and (suspected) exposure to covid-19: Secondary | ICD-10-CM

## 2019-02-02 LAB — NOVEL CORONAVIRUS, NAA: SARS-CoV-2, NAA: NOT DETECTED

## 7561-11-10 DEATH — deceased
